# Patient Record
Sex: Female | Born: 1955 | Race: White | Hispanic: No | Marital: Married | Smoking: Never smoker
Health system: Southern US, Community
[De-identification: ages and names within clinical notes are randomized; demographics above are authoritative.]

## PROBLEM LIST (undated history)

## (undated) DIAGNOSIS — A6000 Herpesviral infection of urogenital system, unspecified: Secondary | ICD-10-CM

## (undated) DIAGNOSIS — R112 Nausea with vomiting, unspecified: Secondary | ICD-10-CM

## (undated) DIAGNOSIS — F329 Major depressive disorder, single episode, unspecified: Secondary | ICD-10-CM

## (undated) DIAGNOSIS — T8859XA Other complications of anesthesia, initial encounter: Secondary | ICD-10-CM

## (undated) DIAGNOSIS — Z9889 Other specified postprocedural states: Secondary | ICD-10-CM

## (undated) DIAGNOSIS — G4700 Insomnia, unspecified: Secondary | ICD-10-CM

## (undated) DIAGNOSIS — F32A Depression, unspecified: Secondary | ICD-10-CM

## (undated) DIAGNOSIS — F419 Anxiety disorder, unspecified: Secondary | ICD-10-CM

## (undated) DIAGNOSIS — G43909 Migraine, unspecified, not intractable, without status migrainosus: Secondary | ICD-10-CM

## (undated) HISTORY — DX: Major depressive disorder, single episode, unspecified: F32.9

## (undated) HISTORY — DX: Herpesviral infection of urogenital system, unspecified: A60.00

## (undated) HISTORY — DX: Insomnia, unspecified: G47.00

## (undated) HISTORY — PX: AUGMENTATION MAMMAPLASTY: SUR837

## (undated) HISTORY — DX: Anxiety disorder, unspecified: F41.9

## (undated) HISTORY — DX: Depression, unspecified: F32.A

---

## 1979-01-06 HISTORY — PX: ABDOMINAL HYSTERECTOMY: SHX81

## 1995-01-06 HISTORY — PX: BREAST BIOPSY: SHX20

## 1995-01-06 HISTORY — PX: BREAST EXCISIONAL BIOPSY: SUR124

## 1997-06-18 ENCOUNTER — Other Ambulatory Visit: Admission: RE | Admit: 1997-06-18 | Discharge: 1997-06-18 | Payer: Self-pay | Admitting: Oral Surgery

## 1997-09-18 ENCOUNTER — Other Ambulatory Visit: Admission: RE | Admit: 1997-09-18 | Discharge: 1997-09-18 | Payer: Self-pay | Admitting: Gynecology

## 1998-09-24 ENCOUNTER — Other Ambulatory Visit: Admission: RE | Admit: 1998-09-24 | Discharge: 1998-09-24 | Payer: Self-pay | Admitting: Gynecology

## 1998-10-22 ENCOUNTER — Encounter: Admission: RE | Admit: 1998-10-22 | Discharge: 1998-10-22 | Payer: Self-pay | Admitting: Gynecology

## 1998-10-22 ENCOUNTER — Encounter: Payer: Self-pay | Admitting: Gynecology

## 1999-01-16 ENCOUNTER — Encounter: Payer: Self-pay | Admitting: Internal Medicine

## 1999-01-16 ENCOUNTER — Inpatient Hospital Stay (HOSPITAL_COMMUNITY): Admission: EM | Admit: 1999-01-16 | Discharge: 1999-01-17 | Payer: Self-pay | Admitting: Emergency Medicine

## 1999-01-17 ENCOUNTER — Encounter: Payer: Self-pay | Admitting: Internal Medicine

## 1999-09-30 ENCOUNTER — Other Ambulatory Visit: Admission: RE | Admit: 1999-09-30 | Discharge: 1999-09-30 | Payer: Self-pay | Admitting: Gynecology

## 1999-10-23 ENCOUNTER — Encounter: Admission: RE | Admit: 1999-10-23 | Discharge: 1999-10-23 | Payer: Self-pay | Admitting: Gynecology

## 1999-10-23 ENCOUNTER — Encounter: Payer: Self-pay | Admitting: Gynecology

## 2000-09-29 ENCOUNTER — Other Ambulatory Visit: Admission: RE | Admit: 2000-09-29 | Discharge: 2000-09-29 | Payer: Self-pay | Admitting: Gynecology

## 2000-10-27 ENCOUNTER — Encounter: Admission: RE | Admit: 2000-10-27 | Discharge: 2000-10-27 | Payer: Self-pay | Admitting: Gynecology

## 2000-10-27 ENCOUNTER — Encounter: Payer: Self-pay | Admitting: Gynecology

## 2000-11-01 ENCOUNTER — Encounter: Payer: Self-pay | Admitting: Gynecology

## 2000-11-01 ENCOUNTER — Encounter: Admission: RE | Admit: 2000-11-01 | Discharge: 2000-11-01 | Payer: Self-pay | Admitting: Gynecology

## 2001-03-22 ENCOUNTER — Encounter: Payer: Self-pay | Admitting: Gynecology

## 2001-03-22 ENCOUNTER — Encounter: Admission: RE | Admit: 2001-03-22 | Discharge: 2001-03-22 | Payer: Self-pay | Admitting: Gynecology

## 2001-06-28 ENCOUNTER — Inpatient Hospital Stay (HOSPITAL_COMMUNITY): Admission: EM | Admit: 2001-06-28 | Discharge: 2001-06-30 | Payer: Self-pay | Admitting: Emergency Medicine

## 2001-06-29 ENCOUNTER — Encounter: Payer: Self-pay | Admitting: Internal Medicine

## 2001-09-29 ENCOUNTER — Other Ambulatory Visit: Admission: RE | Admit: 2001-09-29 | Discharge: 2001-09-29 | Payer: Self-pay | Admitting: Gynecology

## 2001-11-02 ENCOUNTER — Encounter: Admission: RE | Admit: 2001-11-02 | Discharge: 2001-11-02 | Payer: Self-pay | Admitting: Gynecology

## 2001-11-02 ENCOUNTER — Encounter: Payer: Self-pay | Admitting: Gynecology

## 2002-10-02 ENCOUNTER — Other Ambulatory Visit: Admission: RE | Admit: 2002-10-02 | Discharge: 2002-10-02 | Payer: Self-pay | Admitting: Gynecology

## 2002-12-19 ENCOUNTER — Encounter: Admission: RE | Admit: 2002-12-19 | Discharge: 2002-12-19 | Payer: Self-pay | Admitting: Gynecology

## 2003-09-24 ENCOUNTER — Other Ambulatory Visit: Admission: RE | Admit: 2003-09-24 | Discharge: 2003-09-24 | Payer: Self-pay | Admitting: Gynecology

## 2004-01-11 ENCOUNTER — Encounter: Admission: RE | Admit: 2004-01-11 | Discharge: 2004-01-11 | Payer: Self-pay | Admitting: Gynecology

## 2004-09-03 ENCOUNTER — Encounter: Admission: RE | Admit: 2004-09-03 | Discharge: 2004-09-03 | Payer: Self-pay | Admitting: Gynecology

## 2004-09-18 ENCOUNTER — Encounter: Admission: RE | Admit: 2004-09-18 | Discharge: 2004-09-18 | Payer: Self-pay | Admitting: Gynecology

## 2004-09-18 HISTORY — PX: BREAST CYST ASPIRATION: SHX578

## 2004-10-21 ENCOUNTER — Other Ambulatory Visit: Admission: RE | Admit: 2004-10-21 | Discharge: 2004-10-21 | Payer: Self-pay | Admitting: Gynecology

## 2005-10-27 ENCOUNTER — Other Ambulatory Visit: Admission: RE | Admit: 2005-10-27 | Discharge: 2005-10-27 | Payer: Self-pay | Admitting: Gynecology

## 2005-12-16 ENCOUNTER — Ambulatory Visit: Payer: Self-pay | Admitting: Gastroenterology

## 2006-01-08 ENCOUNTER — Ambulatory Visit: Payer: Self-pay | Admitting: Gastroenterology

## 2006-01-22 ENCOUNTER — Ambulatory Visit: Payer: Self-pay | Admitting: Gastroenterology

## 2006-09-29 ENCOUNTER — Encounter: Admission: RE | Admit: 2006-09-29 | Discharge: 2006-09-29 | Payer: Self-pay | Admitting: Gynecology

## 2006-11-01 ENCOUNTER — Other Ambulatory Visit: Admission: RE | Admit: 2006-11-01 | Discharge: 2006-11-01 | Payer: Self-pay | Admitting: Gynecology

## 2007-09-30 ENCOUNTER — Encounter: Admission: RE | Admit: 2007-09-30 | Discharge: 2007-09-30 | Payer: Self-pay | Admitting: Gynecology

## 2007-11-21 ENCOUNTER — Other Ambulatory Visit: Admission: RE | Admit: 2007-11-21 | Discharge: 2007-11-21 | Payer: Self-pay | Admitting: Gynecology

## 2009-01-05 HISTORY — PX: BREAST SURGERY: SHX581

## 2009-07-15 ENCOUNTER — Encounter: Admission: RE | Admit: 2009-07-15 | Discharge: 2009-07-15 | Payer: Self-pay | Admitting: Internal Medicine

## 2010-05-23 NOTE — H&P (Signed)
. [facility]  Patient:    Briana Turner, Briana Turner Visit Number: [id number] MRN: [id number]          Service Type: MED Location: [phone] Attending Physician:  Dolores Patty Dictated by:   Titus Dubin. Alwyn Ren, M.D. [facility] Admit Date:  06/28/2001   CC:         Sonda Primes, M.D.   History and Physical  HISTORY OF PRESENT ILLNESS:  Briana Turner is a 55 year old white female admitted with community acquired atypical pneumonia.  At approximately 12 midnight on 06/25/01, Briana Turner began to have arthralgia, myalgias, and generalized pain associated with chills and fever.  This persisted over the next 24 to 36 hours with temperatures up to 100.7.  This was also associated with shortness of breath and nonproductive cough. Additionally, Briana Turner has had abdominal pain related to the cough, and some bilateral pleuritic pain with deep inspiration.  The shortness of breath has been progressive over the last 24 hours, associated with the pleuritic pain, particularly in the interscapular area.  On Friday evening Briana Turner did feel something brush against her leg.  Briana Turner subsequently found a small punctate lesion, but did not definitely have tick exposure.  Briana Turner has had no other significant travel except to the mountains on the weekends.  Her brother went to [place] in April 2003, but has not been ill.  PAST MEDICAL HISTORY: 1. Partial hysterectomy for heavy menses and cysts. 2. Oophorectomy. 3. Chest pain admission in 1997 which was negative for cardiac disease. 4. Breast biopsy as an outpatient.  CURRENT MEDICATIONS:  Prophylactic acyclovir 400 mg b.i.d.  ALLERGIES:  No known drug allergies.  SOCIAL HISTORY:  Briana Turner does not smoke or drink.  Briana Turner exercises regularly.  FAMILY HISTORY:  Negative for pulmonary disease.  Her mother had myocardial infarction.  Father had cancer of the throat.  Sister had open heart surgery.  In addition to the review of systems as noted  above, Briana Turner has had diffuse headaches.  Briana Turner also has had urinary infrequency.  Briana Turner denies any change in bowel habits.  PHYSICAL EXAMINATION:  VITAL SIGNS:  Briana Turner appears acutely ill with a temperature of 101.9, blood pressure 116/74, pulse 98, respiratory rate 36, and O2 saturations 98% on room air.  HEENT:  Briana Turner exhibits a barky raspy cough.  Pupils are equal, round and reactive to light.  Fundal examination is benign.  There is mild erythema of the oropharynx.  Briana Turner has no lymphadenopathy of the head, neck or axilla. Tympanic membranes are slightly erythematous.  There is mild erythema of the nares.  NECK:  Supple.  Thyroid is normal to palpation.  CHEST:  Clear to auscultation, although Briana Turner coughs repeatedly.  There is a grade 1 systolic murmur.  ABDOMEN:  There is dullness in the right upper quadrant, and Briana Turner tends to splint somewhat.  There is no definite organomegaly.  Briana Turner is well tanned.  NEUROLOGIC:  There are no localizing neurologic signs.  BREASTS/PELVIC/RECTAL:  Initially deferred as they are not pertinent for this admission.  EXTREMITIES:  All pulses are intact, no deficits.  No aneurysms or bruits are noted.  No clubbing or cyanosis is present.  LABORATORY DATA:  Chest x-ray reveals bilateral pneumonia with radiographic negative for pulmonary edema pattern.  White blood cell count 7800, hematocrit 42.4.  Urine shows 2+ bacteria.  PLAN:  Briana Turner will be admitted with blood cultures and urine cultures.  Briana Turner will be placed on Rocephin and Zithromax intravenously.  [facility]  Spotted Fever titer will be collected.  Pulmonary toilet will be initiated. Dictated by:   Titus Dubin. Alwyn Ren, M.D. [facility] Attending Physician:  Dolores Patty DD:  06/28/01 TD:  06/29/01 Job: [id number]

## 2010-05-23 NOTE — Assessment & Plan Note (Signed)
[facility]                         GASTROENTEROLOGY OFFICE NOTE   NAME:Briana, Turner                       MRN:          [id number]  DATE:12/16/2005                            DOB:          04/10/1955    REFERRED BY:  Laurene Footman, P.A., [facility] [address].   REASON FOR REFERRAL:  Laurene Footman asked me to evaluate [name] in  consultation regarding intermittent rectal bleeding.   HPI:  [name] is a very pleasant 55 year old woman, who over the past  year, has had intermittent mild rectal bleeding.  She describes this as  bright red.  It is usually on the tissue paper.  Sometimes there is  dripping of blood into the water.  This happens approximately once every  month or two.  It lasts for two to three days.  It only occurs with  bowel movements.  She does not sound to be particularly constipated  around these times.  In fact, she is never really bothered by  constipation.   She also has rare intermittent pyrosis that happens every month or two.  This is taken care of by Tums only.  She has no dysphagia.   REVIEW OF SYSTEMS:  Notable for stable weight and is otherwise  essentially normal and is available on her nursing intake sheet.   PAST MEDICAL HISTORY:  Genital herpes, well-treated with twice daily  acyclovir.  Depression.  Hysterectomy in 1981.   CURRENT MEDICATIONS:  Acyclovir, multivitamins and sertraline.   ALLERGIES:  No known drug allergies.   SOCIAL HISTORY:  Divorced, lives by herself.  Works as an Print production planner  for [employer].  Nonsmoker, nondrinker.   FAMILY HISTORY:  No colon cancer or colon polyps in family.   PHYSICAL EXAMINATION:  The patient is 5 feet 3 inches, 111 pounds.  Blood pressure is 112/56, pulse 66.  CONSTITUTIONAL:  Generally well-appearing.  NEUROLOGIC:  Alert and oriented times three.  EYES:  Extraocular movements are intact.  MOUTH:  Oropharynx moist, no lesions.  NECK:  Supple, no  lymphadenopathy.  CARDIOVASCULAR/HEART:  Regular rate and rhythm.  LUNGS:  Clear to auscultation bilaterally.  ABDOMEN:  Soft, nontender, nondistended.  Normal bowel sounds.  EXTREMITIES:  No extremity edema.  SKIN:  No rashes or lesions on the lower extremities.   ASSESSMENT AND PLAN:  The patient is a 55 year old woman with  intermittent minor rectal bleeding.   First, she does not appear anemic, although we will make sure she has  had a recent CBC, just to be sure.  She had blood drawn at Urgent Care  in [place].  We will have those faxed over.  She did not have a CBC.  We  will do that for her today.  This seems like it is likely hemorrhoidal  bleeding, given its minor nature.  Other possibilities include anal  fissuring, although this usually causes some pain, or a rectal neoplasm.  We will arrange for her to have full colonoscopy at her soonest  convenience.  She prefers to wait until January.  I think that is  reasonable as long  as she is not indeed anemic.  If she is anemic, I  would want to do this a bit sooner than January.     [name], MD  Electronically Signed    DPJ/MedQ  DD: 12/16/2005  DT: 12/16/2005  Job #: [id number]   cc:   Laurene Footman, [facility]gent [facility], [place]

## 2010-05-23 NOTE — Discharge Summary (Signed)
[place]  Patient:    LELANIA, BIA Visit Number: [id number] MRN: [id number]          Service Type: MED Location: [phone] Attending Physician:  Dolores Patty Dictated by:   Cornell Barman, P.A. Admit Date:  06/28/2001 Discharge Date: 06/30/2001   CC:         Sonda Primes, M.D.   Discharge Summary  DISCHARGE DIAGNOSES: 1. Fever. 2. Bilateral patchy pneumonia.  BRIEF ADMISSION HISTORY:  The patient is a 55 year old white female admitted with a community acquired atypical pneumonia.  She described the onset of arthralgias, myalgias, fever and chills on June 25, 2001. She also developed fever up to 100.7.  She then developed shortness of breath and a nonproductive cough.  The patient was seen at Urgent Care on June 28, 2001 and diagnosed with bilateral patchy pneumonia.  The patients white count was normal at that time.  She was sent for admission at [facility].  PAST MEDICAL HISTORY: 1. Status post partial hysterectomy and oophorectomy. 2. History of chest pain in 1997, negative for cardiac disease. 3. Status post breast biopsy as an outpatient.  HOSPITAL COURSE:  #1 - INFECTIOUS DISEASE:  The patient presented with fever.  Her white count was normal.  She had evidence of bilateral patchy pneumonia.  She was empirically started on Rocephin and Zithromax.  The patients fever did resolve, however, she did have one further low grade temperature spike before discharge.  The patients white count remained normal.  Blood cultures were negative.  Follow-up chest x-ray in the hospital did show stable patchy bilateral air space disease with a nodular component.  The patient was changed to oral antibiotics on June 29, 2001.  The patients 02 saturations remained greater than 98% on room air.  We did check a [facility] spotted fever titer which is still pending.  The patient did have a nodular component on her chest  x-ray and it was recommended that she have a follow-up chest x-ray versus CT of the chest after she completes her antibiotic course.  LABORATORIES AT DISCHARGE:  B-met was normal.  [facility] spotted fever titer, IgG and IgM are pending.  Blood cultures were negative times two.  MEDICATIONS AT DISCHARGE: 1. Ceftin 250 mg b.i.d. for seven days. 2. Zithromax 250 mg q.d. for two days. 3. Acyclovir 400 mg as at home. 4. Guaifenesin 600 mg two tablets b.i.d. for seven days. 5. Tylenol and ibuprofen as needed.  FOLLOW-UP:  Follow-up with Dr. Posey Rea on Tuesday, July 8 at 10:45.  At that time she should have a chest x-ray or at the discretion of Dr. Posey Rea, a CT of the chest. Dictated by:   Cornell Barman, P.A. Attending Physician:  Dolores Patty DD:  06/30/01 TD:  07/01/01 Job: [id number]

## 2011-01-23 ENCOUNTER — Ambulatory Visit (INDEPENDENT_AMBULATORY_CARE_PROVIDER_SITE_OTHER): Payer: BC Managed Care – PPO

## 2011-01-23 DIAGNOSIS — S9030XA Contusion of unspecified foot, initial encounter: Secondary | ICD-10-CM

## 2011-01-23 DIAGNOSIS — M79609 Pain in unspecified limb: Secondary | ICD-10-CM

## 2011-03-29 ENCOUNTER — Other Ambulatory Visit: Payer: Self-pay | Admitting: Physician Assistant

## 2011-05-29 ENCOUNTER — Other Ambulatory Visit: Payer: Self-pay | Admitting: Internal Medicine

## 2011-05-29 DIAGNOSIS — Z1231 Encounter for screening mammogram for malignant neoplasm of breast: Secondary | ICD-10-CM

## 2011-06-16 ENCOUNTER — Ambulatory Visit
Admission: RE | Admit: 2011-06-16 | Discharge: 2011-06-16 | Disposition: A | Discharge status: Home / Self Care | Payer: Self-pay | Source: Ambulatory Visit | Attending: Internal Medicine | Admitting: Internal Medicine

## 2011-06-16 ENCOUNTER — Other Ambulatory Visit: Payer: Self-pay | Admitting: Internal Medicine

## 2011-06-16 DIAGNOSIS — Z1231 Encounter for screening mammogram for malignant neoplasm of breast: Secondary | ICD-10-CM

## 2011-06-29 ENCOUNTER — Other Ambulatory Visit: Payer: Self-pay | Admitting: Physician Assistant

## 2011-09-24 ENCOUNTER — Ambulatory Visit: Payer: BC Managed Care – PPO

## 2011-09-24 ENCOUNTER — Ambulatory Visit (INDEPENDENT_AMBULATORY_CARE_PROVIDER_SITE_OTHER): Payer: BC Managed Care – PPO | Admitting: Family Medicine

## 2011-09-24 VITALS — BP 122/74 | HR 67 | Temp 98.6°F | Resp 16 | Ht 63.5 in | Wt 114.0 lb

## 2011-09-24 DIAGNOSIS — J069 Acute upper respiratory infection, unspecified: Secondary | ICD-10-CM

## 2011-09-24 DIAGNOSIS — J040 Acute laryngitis: Secondary | ICD-10-CM

## 2011-09-24 DIAGNOSIS — J40 Bronchitis, not specified as acute or chronic: Secondary | ICD-10-CM

## 2011-09-24 DIAGNOSIS — R05 Cough: Secondary | ICD-10-CM

## 2011-09-24 DIAGNOSIS — R071 Chest pain on breathing: Secondary | ICD-10-CM

## 2011-09-24 MED ORDER — SERTRALINE HCL 100 MG PO TABS: 150.0000 mg | ORAL_TABLET | Freq: Every day | ORAL | Status: DC

## 2011-09-24 MED ORDER — HYDROCOD POLST-CHLORPHEN POLST 10-8 MG/5ML PO LQCR: 5.0000 mL | Freq: Two times a day (BID) | ORAL | Status: DC | PRN

## 2011-09-24 MED ORDER — ACYCLOVIR 400 MG PO TABS: 400.0000 mg | ORAL_TABLET | Freq: Two times a day (BID) | ORAL | Status: DC

## 2011-09-24 MED ORDER — AZITHROMYCIN 250 MG PO TABS: ORAL_TABLET | ORAL | Status: DC

## 2011-09-24 MED ORDER — ZOLPIDEM TARTRATE 10 MG PO TABS: 10.0000 mg | ORAL_TABLET | Freq: Every evening | ORAL | Status: DC | PRN

## 2011-09-24 MED ORDER — METHYLPREDNISOLONE ACETATE 80 MG/ML IJ SUSP
80.0000 mg | Freq: Once | INTRAMUSCULAR | Status: AC
  Administered 2011-09-24: 80 mg via INTRAMUSCULAR

## 2011-09-24 NOTE — Progress Notes (Signed)
  Subjective:    Patient ID: Briana Turner, female    DOB: Apr 21, 1955, 56 y.o.   MRN: [id number]  HPI   Lost voice 4d prev and slowly w/ worsening chest cong, prod cough, sore throat today. Getting worse.  Pt does get layrngitis freq but not usu feel this bad w/ cold. No f/c, tol po, no n/v/c/d. No urine sxs.  Very painful to take a deep breath.     Review of Systems  Constitutional: Positive for activity change and fatigue. Negative for fever and chills.  HENT: Positive for ear pain, congestion, sore throat, rhinorrhea, voice change and postnasal drip. Negative for trouble swallowing and sinus pressure.   Respiratory: Positive for cough. Negative for shortness of breath.   Cardiovascular: Positive for chest pain.  Gastrointestinal: Negative for nausea, vomiting, diarrhea and constipation.  Genitourinary: Negative for dysuria and difficulty urinating.  Neurological: Positive for headaches.       Objective:   Physical Exam  Constitutional: She is oriented to person, place, and time. She appears well-developed and well-nourished. No distress.  HENT:  Head: Normocephalic and atraumatic.  Right Ear: Tympanic membrane, external ear and ear canal normal.  Left Ear: Tympanic membrane, external ear and ear canal normal.  Nose: Mucosal edema and rhinorrhea present. Right sinus exhibits maxillary sinus tenderness. Left sinus exhibits no maxillary sinus tenderness.  Mouth/Throat: Oropharynx is clear and moist. No oropharyngeal exudate.       Pt whispering, when straining voice is barely audible.  Eyes: Conjunctivae normal and EOM are normal. Pupils are equal, round, and reactive to light. No scleral icterus.  Neck: Normal range of motion. Neck supple. No thyromegaly present.  Cardiovascular: Normal rate, regular rhythm, normal heart sounds and intact distal pulses.   Pulmonary/Chest: Effort normal. No respiratory distress. She has no decreased breath sounds. She has wheezes in the left upper  field. She has rhonchi in the right lower field. She exhibits tenderness and bony tenderness. She exhibits no crepitus.  Lymphadenopathy:    She has no cervical adenopathy.  Neurological: She is alert and oriented to person, place, and time.  Skin: Skin is warm and dry. She is not diaphoretic.  Psychiatric: She has a normal mood and affect.         CXR: No significant abnormality seen. Assessment & Plan:  1. Bronchitis - suspect viral, continue with symptomatic care and wait for CXR overread. Tuessionex for cough. Gave [facility] rx for z-pack. Do not fill now but  Can fill if develop f/c/worsening sxs w/ cough, SHoB, etc. Pt agreeable to plan. 2. Laryngitis - IM solumedrol x 1 now in clinic for symptomatic care. 3.  Mood d/o - stable, refilled zoloft 4. Insomnia - ok to cont prn ambien use - only uses 2.5mg  1-2 x/wk 5. HSV - cont prophylactic acyclovir, refilled

## 2011-12-14 ENCOUNTER — Other Ambulatory Visit: Payer: Self-pay | Admitting: Physician Assistant

## 2011-12-15 NOTE — Telephone Encounter (Signed)
Chart pulled to PA pool [id number]

## 2011-12-15 NOTE — Telephone Encounter (Signed)
Please pull chart.

## 2012-01-22 ENCOUNTER — Ambulatory Visit (INDEPENDENT_AMBULATORY_CARE_PROVIDER_SITE_OTHER): Payer: BC Managed Care – PPO | Admitting: Family Medicine

## 2012-01-22 ENCOUNTER — Encounter: Payer: Self-pay | Admitting: Family Medicine

## 2012-01-22 VITALS — BP 110/74 | HR 58 | Temp 97.9°F | Resp 16 | Ht 62.75 in | Wt 115.2 lb

## 2012-01-22 DIAGNOSIS — G4700 Insomnia, unspecified: Secondary | ICD-10-CM

## 2012-01-22 DIAGNOSIS — Z0000 Encounter for general adult medical examination without abnormal findings: Secondary | ICD-10-CM

## 2012-01-22 DIAGNOSIS — R197 Diarrhea, unspecified: Secondary | ICD-10-CM

## 2012-01-22 DIAGNOSIS — F329 Major depressive disorder, single episode, unspecified: Secondary | ICD-10-CM | POA: Insufficient documentation

## 2012-01-22 DIAGNOSIS — F32A Depression, unspecified: Secondary | ICD-10-CM | POA: Insufficient documentation

## 2012-01-22 DIAGNOSIS — B009 Herpesviral infection, unspecified: Secondary | ICD-10-CM

## 2012-01-22 LAB — CBC WITH DIFFERENTIAL/PLATELET
Basophils Absolute: 0.1 10*3/uL (ref 0.0–0.1)
Basophils Relative: 1 % (ref 0–1)
Eosinophils Absolute: 0.1 10*3/uL (ref 0.0–0.7)
Eosinophils Relative: 3 % (ref 0–5)
HCT: 38.5 % (ref 36.0–46.0)
Hemoglobin: 13 g/dL (ref 12.0–15.0)
Lymphocytes Relative: 36 % (ref 12–46)
Lymphs Abs: 1.8 10*3/uL (ref 0.7–4.0)
MCH: 31 pg (ref 26.0–34.0)
MCHC: 33.8 g/dL (ref 30.0–36.0)
MCV: 91.9 fL (ref 78.0–100.0)
Monocytes Absolute: 0.6 10*3/uL (ref 0.1–1.0)
Monocytes Relative: 12 % (ref 3–12)
Neutro Abs: 2.4 10*3/uL (ref 1.7–7.7)
Neutrophils Relative %: 48 % (ref 43–77)
Platelets: 258 10*3/uL (ref 150–400)
RBC: 4.19 MIL/uL (ref 3.87–5.11)
RDW: 12.8 % (ref 11.5–15.5)
WBC: 5 10*3/uL (ref 4.0–10.5)

## 2012-01-22 LAB — COMPREHENSIVE METABOLIC PANEL
ALT: 17 U/L (ref 0–35)
AST: 25 U/L (ref 0–37)
Albumin: 4.2 g/dL (ref 3.5–5.2)
Alkaline Phosphatase: 67 U/L (ref 39–117)
BUN: 20 mg/dL (ref 6–23)
CO2: 27 mEq/L (ref 19–32)
Calcium: 9.6 mg/dL (ref 8.4–10.5)
Chloride: 102 mEq/L (ref 96–112)
Creat: 0.63 mg/dL (ref 0.50–1.10)
Glucose, Bld: 86 mg/dL (ref 70–99)
Potassium: 4.7 mEq/L (ref 3.5–5.3)
Sodium: 138 mEq/L (ref 135–145)
Total Bilirubin: 0.5 mg/dL (ref 0.3–1.2)
Total Protein: 6.7 g/dL (ref 6.0–8.3)

## 2012-01-22 LAB — LIPID PANEL
Cholesterol: 186 mg/dL (ref 0–200)
HDL: 65 mg/dL (ref >39)
LDL Cholesterol: 110 mg/dL — ABNORMAL HIGH (ref 0–99)
Total CHOL/HDL Ratio: 2.9 Ratio
Triglycerides: 56 mg/dL (ref <150)
VLDL: 11 mg/dL (ref 0–40)

## 2012-01-22 LAB — TSH: TSH: 2.122 u[IU]/mL (ref 0.350–4.500)

## 2012-01-22 MED ORDER — ACYCLOVIR 400 MG PO TABS: 400.0000 mg | ORAL_TABLET | Freq: Every day | ORAL | Status: DC

## 2012-01-22 MED ORDER — SERTRALINE HCL 100 MG PO TABS: 150.0000 mg | ORAL_TABLET | Freq: Every day | ORAL | Status: DC

## 2012-01-22 MED ORDER — ZOLPIDEM TARTRATE 10 MG PO TABS: 10.0000 mg | ORAL_TABLET | Freq: Every evening | ORAL | Status: DC | PRN

## 2012-01-22 NOTE — Progress Notes (Signed)
Subjective:    Patient ID: Briana Turner, female    DOB: 12-31-1955, 57 y.o.   MRN: [id number] Chief Complaint  Patient presents with  . Annual Exam    HPI  Briana Turner is a delightful 57 yo female in her her annual preventative care.   Tons of menopausal sxs. Has been on zoloft for depression for years.- no desire to go off even though this was the first time she was an ssri.  Chronically depressed her whole life - just woke up every morning thinking that she wanted to die for most of her life, but doing great and no problems ever since starting on the zoloft about 10 yr ago. Using [place] rarely  Has been able to cut acyclovir back to once a day - no outbreaks.  Fasting today Normal colonoscopy in 2008 - was normal. Recheck in 2018 Had normal mammogram in May 2013 - no h/o abnml. Is taking a mvi, and calcium w/ vit D No h/o abnml paps, s/p hysterectomy for menorrhagia. tdap 05/2010   Got married in Oct and fell that morning req stiches - fell out a door over steps and hit garage floor and faceplanted.  Since then she has had some problems in her right neck and shoulder  everytime she eats, has diarrhea, no abd pain. No flatulence or gas, but are stools liquid -  happening most meals - happens w/ almost every food except for peanut butter and crackers, but no cramping, passes the liquid almost immed after eating so difficult to go out.  No traveling or odd foods.  Has been happening consistently for the past sev mos and seems to be worsening.  No past medical history on file. Past Surgical History  Procedure Date  . Breast surgery - implants 2011  . Abdominal hysterectomy 1981    PARTIAL  Has had hysterectomy because of bad periods in 1981 - no cancer. Still has one ovary (other 1 removed due to painful scar tissue.)     Family History  Problem Relation Age of Onset  . Diabetes Sister   . Heart disease Sister   Dad w/ throat cancer. History   Social History  . Marital Status:  Single    Spouse Name: N/A    Number of Children: N/A  . Years of Education: N/A   Occupational History  . OFFICE MANAGER    Social History Main Topics  . Smoking status: Never Smoker   . Smokeless tobacco: None  . Alcohol Use: No  . Drug Use: No  . Sexually Active: Yes    Birth Control/ Protection: None     Comment: NUMBER OF SEX PARTNERS IN THE LAST 12 - 1   Other Topics Concern  . None   Social History Narrative  . None  Got married in Oct 2012  Current Outpatient Prescriptions on File Prior to Visit  Medication Sig Dispense Refill  . sertraline (ZOLOFT) 100 MG tablet Take 1.5 tablets (150 mg total) by mouth daily.  135 tablet  3  . zolpidem (AMBIEN) 10 MG tablet Take 1 tablet (10 mg total) by mouth at bedtime as needed for sleep.  30 tablet  5   Not on File   Review of Systems  Constitutional: Positive for diaphoresis. Negative for fever, chills, activity change, appetite change, fatigue and unexpected weight change.  HENT: Positive for neck pain and neck stiffness. Negative for hearing loss, ear pain, nosebleeds, congestion, sore throat, facial swelling, rhinorrhea, sneezing, drooling, mouth  sores, trouble swallowing, dental problem, voice change, postnasal drip, sinus pressure, tinnitus and ear discharge.   Eyes: Negative for photophobia, pain, discharge, redness, itching and visual disturbance.  Respiratory: Negative for apnea, cough, choking, chest tightness, shortness of breath, wheezing and stridor.   Cardiovascular: Negative for chest pain, palpitations and leg swelling.  Gastrointestinal: Positive for diarrhea. Negative for nausea, vomiting, abdominal pain, constipation, blood in stool, abdominal distention, anal bleeding and rectal pain.  Genitourinary: Negative for dysuria, urgency, frequency, hematuria, flank pain, decreased urine volume, vaginal bleeding, vaginal discharge, enuresis, difficulty urinating, genital sores, vaginal pain, menstrual problem, pelvic  pain and dyspareunia.  Musculoskeletal: Positive for myalgias and arthralgias. Negative for back pain, joint swelling and gait problem.  Skin: Negative for color change, pallor, rash and wound.  Neurological: Negative for dizziness, tremors, seizures, syncope, facial asymmetry, speech difficulty, weakness, light-headedness, numbness and headaches.  Hematological: Negative for adenopathy. Does not bruise/bleed easily.  Psychiatric/Behavioral: Negative for suicidal ideas, hallucinations, behavioral problems, confusion, sleep disturbance, self-injury, dysphoric mood, decreased concentration and agitation. The patient is not nervous/anxious and is not hyperactive.      BP 110/74  Pulse 58  Temp 97.9 F (36.6 C) (Oral)  Resp 16  Ht 5' 2.75" (1.594 m)  Wt 115 lb 3.2 oz (52.254 kg)  BMI 20.57 kg/m2  SpO2 100% Objective:   Physical Exam  Constitutional: She is oriented to person, place, and time. She appears well-developed and well-nourished. No distress.  HENT:  Head: Normocephalic and atraumatic.  Right Ear: Tympanic membrane, external ear and ear canal normal.  Left Ear: Tympanic membrane, external ear and ear canal normal.  Nose: Nose normal. No mucosal edema or rhinorrhea.  Mouth/Throat: Uvula is midline, oropharynx is clear and moist and mucous membranes are normal. No posterior oropharyngeal erythema.  Eyes: Conjunctivae normal and EOM are normal. Pupils are equal, round, and reactive to light. Right eye exhibits no discharge. Left eye exhibits no discharge. No scleral icterus.  Neck: Normal range of motion. Neck supple. No thyromegaly present.  Cardiovascular: Normal rate, regular rhythm, normal heart sounds and intact distal pulses.   Pulmonary/Chest: Effort normal and breath sounds normal. No respiratory distress.  Abdominal: Soft. Bowel sounds are normal. There is no hepatosplenomegaly. There is generalized tenderness. There is no rigidity, no rebound, no guarding, no CVA tenderness,  no tenderness at McBurney's point and negative Murphy's sign. No hernia.  Genitourinary: No breast swelling, tenderness, discharge or bleeding.       Bilateral breast implants  Musculoskeletal: She exhibits no edema.  Lymphadenopathy:    She has no cervical adenopathy.  Neurological: She is alert and oriented to person, place, and time. She has normal reflexes.  Skin: Skin is warm and dry. She is not diaphoretic. No erythema.  Psychiatric: She has a normal mood and affect. Her behavior is normal.      Assessment & Plan:   1. Depression  sertraline (ZOLOFT) 100 MG tablet, Comprehensive metabolic panel, TSH  2. Insomnia  zolpidem (AMBIEN) 10 MG tablet, CBC with Differential  3. Herpes  acyclovir (ZOVIRAX) 400 MG tablet  4. Diarrhea  IFOBT POC (occult bld, rslt in office), Stool culture, Ova and parasite examination, Clostridium difficile EIA, Fecal lactoferrin  5. Routine general medical examination at a health care facility  Lipid panel.  Does not need any further pap smears in life due to benign hysterectomy.  Repeat colonoscopy due 2018, was nml. Mammogram done 05/2011 - UTD and nml. tdap 05/2010

## 2012-01-24 ENCOUNTER — Encounter: Payer: Self-pay | Admitting: Family Medicine

## 2012-01-25 ENCOUNTER — Other Ambulatory Visit: Payer: Self-pay | Admitting: Radiology

## 2012-01-25 LAB — IFOBT (OCCULT BLOOD): IFOBT: NEGATIVE

## 2012-01-25 NOTE — Addendum Note (Signed)
Addended by: Sheldon Silvan on: 01/25/2012 08:32 AM   Modules accepted: Orders

## 2012-01-26 LAB — OVA AND PARASITE EXAMINATION: OP: NONE SEEN

## 2012-01-26 LAB — FECAL LACTOFERRIN, QUANT: Lactoferrin: NEGATIVE

## 2012-01-26 LAB — CLOSTRIDIUM DIFFICILE EIA: CDIFTX: NEGATIVE

## 2012-01-28 ENCOUNTER — Telehealth: Payer: Self-pay

## 2012-01-28 DIAGNOSIS — K529 Noninfective gastroenteritis and colitis, unspecified: Secondary | ICD-10-CM

## 2012-01-28 NOTE — Telephone Encounter (Signed)
Pt states dr Clelia Croft wanted to know the type of vitamin and hot flash medicine she was on. Pt states she takes Mature MultiVitamins and Remifemin for hot flashes.  bf

## 2012-01-29 LAB — STOOL CULTURE

## 2012-02-05 NOTE — Telephone Encounter (Signed)
Well, as far as i know, none of these should cause chronic diarrhea so since all of her lab testing came back negative, it is ok for her to take imodium and please refer to to GI for further eval.

## 2012-02-09 NOTE — Telephone Encounter (Signed)
Was confused about where this came from, was attatched to lab, called patient left message to advise will proceed with a referral to GI. Left message for her to call back and advise where she has gone before for GI.

## 2012-09-07 ENCOUNTER — Ambulatory Visit (INDEPENDENT_AMBULATORY_CARE_PROVIDER_SITE_OTHER): Payer: BC Managed Care – PPO | Admitting: Family Medicine

## 2012-09-07 VITALS — BP 152/84 | HR 75 | Temp 97.8°F | Resp 20 | Ht 63.0 in | Wt 118.6 lb

## 2012-09-07 DIAGNOSIS — M791 Myalgia, unspecified site: Secondary | ICD-10-CM

## 2012-09-07 DIAGNOSIS — IMO0001 Reserved for inherently not codable concepts without codable children: Secondary | ICD-10-CM

## 2012-09-07 DIAGNOSIS — M542 Cervicalgia: Secondary | ICD-10-CM

## 2012-09-07 MED ORDER — METAXALONE 800 MG PO TABS: 400.0000 mg | ORAL_TABLET | Freq: Three times a day (TID) | ORAL | Status: DC | PRN

## 2012-09-07 NOTE — Progress Notes (Signed)
   [address] [place] [address]   Phone [phone]  Subjective:    Patient ID: Briana Turner, female    DOB: 28-Sep-1955, 57 y.o.   MRN: [id number]  HPI Pt presents to clinic with acute abrupt onset of R sided neck pain that started about 30 minutes ago that is taking her breath away it is so intense.  It slightly radiates down her L arm.  She has no h/o of cardiac issues, she has never had anxiety.  She has never had a known muscle spasm so unsure what they feel like but she knows she has never had pain like this before.  She does not feel like she is really SOB but the pain is definitely taking away her breath.  Does not hurt to move her arm. She was at the hair dresser when this started getting her hair cut and did not feel like she was holding her head funny.  Pt was brought back urgently and I was pulled into the room for immediate evaluation of the patient.  Review of Systems  Constitutional: Negative for fever and chills.  HENT: Positive for neck pain.   Respiratory: Positive for shortness of breath (related to the pain).   Neurological: Negative for numbness and headaches.       Objective:   Physical Exam  Vitals reviewed. Constitutional: She is oriented to person, place, and time. She appears well-developed and well-nourished.  HENT:  Head: Normocephalic and atraumatic.  Right Ear: External ear normal.  Left Ear: External ear normal.  Cardiovascular: Normal rate, regular rhythm and normal heart sounds.   No murmur heard. Pulmonary/Chest: Effort normal and breath sounds normal.  Musculoskeletal:       Arms: Good arm strength.  With arm movements increase pain with shoulder abduction. Palpable muscle spasm in L trapezius.  Neurological: She is alert and oriented to person, place, and time.  Skin: Skin is warm and dry.  Psychiatric: She has a normal mood and affect. Her behavior is normal. Judgment and thought content normal.    EKG - NSR without acute  changes.  When patient laid down for her EKG the pain resolved with head support.     Assessment & Plan:  Neck pain on left side - Plan: EKG 12-Lead  Muscle pain - Plan: metaxalone (SKELAXIN) 800 MG tablet  D/w pt the administration of IM Valium but due to significant pain reduction with head support pt would like to go home and heat the area and use NSAIDs.  I have sent Skelaxin to the pharmacy for patient to pick up if she needs it.  She will RTC if she has continued problems.  D/w Dr Neva Seat.  Benny Lennert PA-C 09/07/2012 6:41 PM

## 2012-09-08 NOTE — Progress Notes (Signed)
EKG read and patient discussed with Sarah Weber, PA-C. Agree with assessment and plan of care per her note.   

## 2012-11-15 ENCOUNTER — Other Ambulatory Visit: Payer: Self-pay | Admitting: Family Medicine

## 2012-11-16 NOTE — Telephone Encounter (Signed)
 CSD reviewed. Pt has only filled ambien twice in the past yr and only once from the rx I gave her in Jan. Other 4 refills had expired so using VERY rarely.

## 2012-12-10 ENCOUNTER — Ambulatory Visit (INDEPENDENT_AMBULATORY_CARE_PROVIDER_SITE_OTHER): Payer: BC Managed Care – PPO | Admitting: Family Medicine

## 2012-12-10 VITALS — BP 108/70 | HR 76 | Temp 98.4°F | Resp 16 | Ht 63.5 in | Wt 118.0 lb

## 2012-12-10 DIAGNOSIS — H53412 Scotoma involving central area, left eye: Secondary | ICD-10-CM

## 2012-12-10 DIAGNOSIS — H53419 Scotoma involving central area, unspecified eye: Secondary | ICD-10-CM

## 2012-12-10 DIAGNOSIS — R05 Cough: Secondary | ICD-10-CM

## 2012-12-10 DIAGNOSIS — J040 Acute laryngitis: Secondary | ICD-10-CM

## 2012-12-10 LAB — POCT RAPID STREP A (OFFICE): Rapid Strep A Screen: NEGATIVE

## 2012-12-10 NOTE — Progress Notes (Addendum)
Subjective:   This chart was scribed for Norberto Sorenson, MD by Arlan Organ, ED Scribe. This patient was seen in room Room/bed 4 and the patient's care was started 1:37 PM.    Patient ID: Briana Turner, female    DOB: 06-30-1955, 57 y.o.   MRN: [id number]  Chief Complaint  Patient presents with  . Spots and/or Floaters     L eye x 4 hours ago  . Cough    non productive x 5 days  . Laryngitis    x 5 days     HPI HPI Comments: Briana Turner is a 57 y.o. female who presents to [facility] complaining of laryngitis that first started 1 week ago. She also reports flu like symptoms, a non productive cough, a week long ongoing HA, and "spots" in her left visual field just lateral to the center of her eye she noticed 4 hours ago. She describes the spots as "dark spots clustered together". She states she initially experienced a few minutes of dizziness and lightheadedness prior to noticing the spots in her visual field. She says the spots are stationary and never move position, but track when she moved her eyes. She denies any recent trauma to her head. She states otherwise her vision is unchanged. She reports a subjective fever and chills 4 days ago, that have both resolved now. She denies any photophobia.    Ophthalmologist- Dr. London Sheer  No past medical history on file.   No Known Allergies   Current Outpatient Prescriptions on File Prior to Visit  Medication Sig Dispense Refill  . acyclovir (ZOVIRAX) 400 MG tablet Take 1 tablet (400 mg total) by mouth daily.  90 tablet  3  . sertraline (ZOLOFT) 100 MG tablet Take 1.5 tablets (150 mg total) by mouth daily.  135 tablet  3  . zolpidem (AMBIEN) 10 MG tablet TAKE ONE TABLET BY MOUTH ONCE DAILY AT BEDTIME FOR SLEEP  30 tablet  0  . metaxalone (SKELAXIN) 800 MG tablet Take 0.5-1 tablets (400-800 mg total) by mouth 3 (three) times daily as needed for pain.  10 tablet  0   No current facility-administered medications on file prior to visit.      Review of Systems  Constitutional: Negative for fever and chills.  HENT: Negative for congestion, sinus pressure and sore throat.   Eyes: Positive for visual disturbance. Negative for photophobia, pain, discharge and itching.  Respiratory: Positive for cough.   Musculoskeletal: Negative for myalgias.  Skin: Negative for rash.  Neurological: Positive for dizziness and light-headedness.    BP 108/70  Pulse 76  Temp(Src) 98.4 F (36.9 C) (Oral)  Resp 16  Ht 5' 3.5" (1.613 m)  Wt 118 lb (53.524 kg)  BMI 20.57 kg/m2  SpO2 99%  Objective:   Physical Exam  Nursing note and vitals reviewed. Constitutional: She is oriented to person, place, and time. She appears well-developed and well-nourished.  HENT:  Head: Normocephalic and atraumatic.  Right Ear: External ear normal. Tympanic membrane is not erythematous, not retracted and not bulging.  Left Ear: External ear normal. Tympanic membrane is not erythematous, not retracted and not bulging.  Mouth/Throat: Oropharyngeal exudate and posterior oropharyngeal erythema present. No posterior oropharyngeal edema.  Right worse then left oropharyngeal exudate  Eyes: Conjunctivae, EOM and lids are normal. Pupils are equal, round, and reactive to light. Lids are everted and swept, no foreign bodies found. Right eye exhibits no chemosis, no discharge and no exudate. Left eye exhibits no  chemosis, no discharge and no exudate.  Neck: Normal range of motion.  Cardiovascular: Normal rate, regular rhythm and normal heart sounds.   Pulmonary/Chest: Effort normal and breath sounds normal.  Musculoskeletal: Normal range of motion.  Neurological: She is alert and oriented to person, place, and time.  Skin: Skin is warm and dry.  Psychiatric: She has a normal mood and affect. Her behavior is normal.   Results for orders placed in visit on 12/10/12  CULTURE, GROUP A STREP      Result Value Range   Organism ID, Bacteria Normal Upper Respiratory Flora      Organism ID, Bacteria No Beta Hemolytic Streptococci Isolated    POCT RAPID STREP A (OFFICE)      Result Value Range   Rapid Strep A Screen Negative  Negative   Assessment & Plan:  Cough - Plan: POCT rapid strep A, Culture, Group A Strep  Laryngitis - Plan: POCT rapid strep A, Culture, Group A Strep - Pt was told to hold steroids, antibiotics or symptomatic cough medication, but to call in for prescriptions if symptoms persist for more than 4 days or if symptoms worsen int that time frame.  Paracentral scotoma of left eye - Plan: Ambulatory referral to Ophthalmology Spoke w/ opthalmologist on call - Dr. Stephannie Li at [facility] who reassured me/pt that This is likely benign and should resolve on its own but pt will be worked in for an OV w/in sev days - contact info given to schedule on Mon.  Advised patient to come back in for more immediate eval if symptoms do not improve or if they worsen.   I personally performed the services described in this documentation, which was scribed in my presence. The recorded information has been reviewed and considered, and addended by me as needed.  Norberto Sorenson, MD MPH

## 2012-12-10 NOTE — Patient Instructions (Signed)
You will want to follow-up with Dr. Stephannie Li at [facility] - please call their office and just tell them that you were seen this weekend and Dr. Allyne Gee said you could be worked in this week.  This is likely benign and should resolve on its own.  [address], [place], [place] [address]    [phone]  Laryngitis At the top of your windpipe is your voice box. It is the source of your voice. Inside your voice box are 2 bands of muscles called vocal cords. When you breathe, your vocal cords are relaxed and open so that air can get into the lungs. When you decide to say something, these cords come together and vibrate. The sound from these vibrations goes into your throat and comes out through your mouth as sound. Laryngitis is an inflammation of the vocal cords that causes hoarseness, cough, loss of voice, sore throat, and dry throat. Laryngitis can be temporary (acute) or long-term (chronic). Most cases of acute laryngitis improve with time.Chronic laryngitis lasts for more than 3 weeks. CAUSES Laryngitis can often be related to excessive smoking, talking, or yelling, as well as inhalation of toxic fumes and allergies. Acute laryngitis is usually caused by a viral infection, vocal strain, measles or mumps, or bacterial infections. Chronic laryngitis is usually caused by vocal cord strain, vocal cord injury, postnasal drip, growths on the vocal cords, or acid reflux. SYMPTOMS   Cough.  Sore throat.  Dry throat. RISK FACTORS  Respiratory infections.  Exposure to irritating substances, such as cigarette smoke, excessive amounts of alcohol, stomach acids, and workplace chemicals.  Voice trauma, such as vocal cord injury from shouting or speaking too loud. DIAGNOSIS  Your cargiver will perform a physical exam. During the physical exam, your caregiver will examine your throat. The most common sign of laryngitis is hoarseness. Laryngoscopy may be necessary to confirm  the diagnosis of this condition. This procedure allows your caregiver to look into the larynx. HOME CARE INSTRUCTIONS  Drink enough fluids to keep your urine clear or pale yellow.  Rest until you no longer have symptoms or as directed by your caregiver.  Breathe in moist air.  Take all medicine as directed by your caregiver.  Do not smoke.  Talk as little as possible (this includes whispering).  Write on paper instead of talking until your voice is back to normal.  Follow up with your caregiver if your condition has not improved after 10 days. SEEK MEDICAL CARE IF:   You have trouble breathing.  You cough up blood.  You have persistent fever.  You have increasing pain.  You have difficulty swallowing. MAKE SURE YOU:  Understand these instructions.  Will watch your condition.  Will get help right away if you are not doing well or get worse. Document Released: 12/22/2004 Document Revised: 03/16/2011 Document Reviewed: 02/27/2010 [facility] Patient Information 2014 [place], [place].

## 2012-12-12 LAB — CULTURE, GROUP A STREP

## 2013-01-23 ENCOUNTER — Other Ambulatory Visit: Payer: Self-pay | Admitting: Family Medicine

## 2013-01-24 NOTE — Telephone Encounter (Signed)
Called in.

## 2013-03-27 ENCOUNTER — Other Ambulatory Visit: Payer: Self-pay | Admitting: Family Medicine

## 2013-03-27 NOTE — Telephone Encounter (Signed)
Pt was seen in Dec but not for this med. Can we give RFs?

## 2013-05-08 ENCOUNTER — Ambulatory Visit (INDEPENDENT_AMBULATORY_CARE_PROVIDER_SITE_OTHER): Payer: BC Managed Care – PPO | Admitting: Family Medicine

## 2013-05-08 VITALS — BP 128/72 | HR 74 | Temp 98.3°F | Resp 16 | Ht 63.5 in | Wt 121.2 lb

## 2013-05-08 DIAGNOSIS — F329 Major depressive disorder, single episode, unspecified: Secondary | ICD-10-CM

## 2013-05-08 DIAGNOSIS — F32A Depression, unspecified: Secondary | ICD-10-CM

## 2013-05-08 DIAGNOSIS — L237 Allergic contact dermatitis due to plants, except food: Secondary | ICD-10-CM

## 2013-05-08 DIAGNOSIS — L255 Unspecified contact dermatitis due to plants, except food: Secondary | ICD-10-CM

## 2013-05-08 MED ORDER — TRIAMCINOLONE ACETONIDE 0.1 % EX CREA: 1.0000 "application " | TOPICAL_CREAM | Freq: Three times a day (TID) | CUTANEOUS | Status: DC | PRN

## 2013-05-08 MED ORDER — PREDNISONE 10 MG PO TABS
ORAL_TABLET | ORAL | Status: DC
Start: 2013-05-08 — End: 2013-06-02

## 2013-05-08 MED ORDER — SERTRALINE HCL 100 MG PO TABS: 150.0000 mg | ORAL_TABLET | Freq: Every day | ORAL | Status: DC

## 2013-05-08 NOTE — Progress Notes (Signed)
Subjective:    Patient ID: Briana Turner, female    DOB: [birth year], 58 y.o.   MRN: [id number] Chief Complaint  Patient presents with  . Poison Ivy    Arms; lower extremities x 3 dys   HPI  Briana Turner was gardening and doing yard work about 10d prev.  Sev days later she developed some spots of poison ivy dermatitis on her extremities and since then it has been progressively spreading - is now everywhere - esp all over arms and legs.  She developed new lesions today.  So itchy she can't sleep or sit.  Has been applying calamine lotion with minimal relief.   Went to the pharmacist to ask them what she could put on it and they recommended she seek medical care for oral treatment.  Otherwise doing well. Has appt w/ me sched for CPE in sev wks. Just needs refill of zoloft to get her to that appt - has other meds.  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   Past Medical History  Diagnosis Date  . Depression   . Insomnia   . Genital HSV    Current Outpatient Prescriptions on File Prior to Visit  Medication Sig Dispense Refill  . acyclovir (ZOVIRAX) 400 MG tablet TAKE 1 TABLET BY MOUTH EVERY DAY  90 tablet  2  . zolpidem (AMBIEN) 10 MG tablet TAKE 1 TABLET BY MOUTH EVERY NIGHT AT BEDTIME AS NEEDED FOR SLEEP  30 tablet  1  . metaxalone (SKELAXIN) 800 MG tablet Take 0.5-1 tablets (400-800 mg total) by mouth 3 (three) times daily as needed for pain.  10 tablet  0   No current facility-administered medications on file prior to visit.   No Known Allergies   Review of Systems  Constitutional: Positive for activity change. Negative for fever, chills, diaphoresis and appetite change.  Eyes: Positive for visual disturbance. Negative for pain, discharge and itching.  Musculoskeletal: Negative for arthralgias and joint swelling.  Skin: Positive for color change, rash and wound. Negative for pallor.  Hematological: Negative for adenopathy. Does not  bruise/bleed easily.  Psychiatric/Behavioral: Positive for sleep disturbance.      BP 128/72  Pulse 74  Temp(Src) 98.3 F (36.8 C) (Oral)  Resp 16  Ht 5' 3.5" (1.613 m)  Wt 121 lb 3.2 oz (54.976 kg)  BMI 21.13 kg/m2  SpO2 100% Objective:   Physical Exam  Constitutional: She is oriented to person, place, and time. She appears well-developed and well-nourished. No distress.  HENT:  Head: Normocephalic and atraumatic.  Right Ear: External ear normal.  Eyes: Conjunctivae are normal. No scleral icterus.  Pulmonary/Chest: Effort normal.  Neurological: She is alert and oriented to person, place, and time.  Skin: Skin is warm and dry. Rash noted. Rash is maculopapular and vesicular. She is not diaphoretic. No erythema.  Diffuse isolated erythematous papules, vesicles, and scaling in round and linear formation diffusely over all extremities. None on trunk.  Psychiatric: She has a normal mood and affect. Her behavior is normal.          Assessment & Plan:   Depression - Plan: sertraline (ZOLOFT) 100 MG tablet  Contact dermatitis due to poison ivy  Meds ordered this encounter  Medications  . sertraline (ZOLOFT) 100 MG tablet    Sig: Take 1.5 tablets (150 mg total) by mouth daily.    Dispense:  135 tablet    Refill:  3  . predniSONE (DELTASONE) 10 MG  tablet    Sig: 6-5-4-3-2-1 tabs po qd x taper 6d    Dispense:  21 tablet    Refill:  0  . triamcinolone cream (KENALOG) 0.1 %    Sig: Apply 1 application topically 3 (three) times daily as needed.    Dispense:  85 g    Refill:  1   Norberto SorensonEva Shaw, MD MPH

## 2013-05-08 NOTE — Patient Instructions (Signed)
Poison Ivy Poison ivy is a inflammation of the skin (contact dermatitis) caused by touching the allergens on the leaves of the ivy plant following previous exposure to the plant. The rash usually appears 48 hours after exposure. The rash is usually bumps (papules) or blisters (vesicles) in a linear pattern. Depending on your own sensitivity, the rash may simply cause redness and itching, or it may also progress to blisters which may break open. These must be well cared for to prevent secondary bacterial (germ) infection, followed by scarring. Keep any open areas dry, clean, dressed, and covered with an antibacterial ointment if needed. The eyes may also get puffy. The puffiness is worst in the morning and gets better as the day progresses. This dermatitis usually heals without scarring, within 2 to 3 weeks without treatment. HOME CARE INSTRUCTIONS  Thoroughly wash with soap and water as soon as you have been exposed to poison ivy. You have about one half hour to remove the plant resin before it will cause the rash. This washing will destroy the oil or antigen on the skin that is causing, or will cause, the rash. Be sure to wash under your fingernails as any plant resin there will continue to spread the rash. Do not rub skin vigorously when washing affected area. Poison ivy cannot spread if no oil from the plant remains on your body. A rash that has progressed to weeping sores will not spread the rash unless you have not washed thoroughly. It is also important to wash any clothes you have been wearing as these may carry active allergens. The rash will return if you wear the unwashed clothing, even several days later. Avoidance of the plant in the future is the best measure. Poison ivy plant can be recognized by the number of leaves. Generally, poison ivy has three leaves with flowering branches on a single stem. Diphenhydramine may be purchased over the counter and used as needed for itching. Do not drive with  this medication if it makes you drowsy.Ask your caregiver about medication for children. SEEK MEDICAL CARE IF:  Open sores develop.  Redness spreads beyond area of rash.  You notice purulent (pus-like) discharge.  You have increased pain.  Other signs of infection develop (such as fever). Document Released: 12/20/1999 Document Revised: 03/16/2011 Document Reviewed: 11/07/2008 [employer] Patient Information 2014 [employer].  

## 2013-06-02 ENCOUNTER — Ambulatory Visit (INDEPENDENT_AMBULATORY_CARE_PROVIDER_SITE_OTHER): Payer: BC Managed Care – PPO | Admitting: Family Medicine

## 2013-06-02 ENCOUNTER — Encounter: Payer: Self-pay | Admitting: Family Medicine

## 2013-06-02 VITALS — BP 110/69 | HR 72 | Temp 98.5°F | Resp 16 | Ht 63.5 in | Wt 119.0 lb

## 2013-06-02 DIAGNOSIS — G4700 Insomnia, unspecified: Secondary | ICD-10-CM

## 2013-06-02 DIAGNOSIS — F329 Major depressive disorder, single episode, unspecified: Secondary | ICD-10-CM

## 2013-06-02 DIAGNOSIS — R400 Somnolence: Secondary | ICD-10-CM

## 2013-06-02 DIAGNOSIS — F32A Depression, unspecified: Secondary | ICD-10-CM

## 2013-06-02 DIAGNOSIS — B009 Herpesviral infection, unspecified: Secondary | ICD-10-CM

## 2013-06-02 DIAGNOSIS — R404 Transient alteration of awareness: Secondary | ICD-10-CM

## 2013-06-02 DIAGNOSIS — Z0000 Encounter for general adult medical examination without abnormal findings: Secondary | ICD-10-CM

## 2013-06-02 LAB — POCT URINALYSIS DIPSTICK
Bilirubin, UA: NEGATIVE
Blood, UA: NEGATIVE
GLUCOSE UA: NEGATIVE
KETONES UA: NEGATIVE
LEUKOCYTES UA: NEGATIVE
Nitrite, UA: NEGATIVE
PROTEIN UA: NEGATIVE
SPEC GRAV UA: 1.02
UROBILINOGEN UA: 0.2
pH, UA: 7

## 2013-06-02 LAB — CBC
HCT: 40.4 % (ref 36.0–46.0)
Hemoglobin: 13.7 g/dL (ref 12.0–15.0)
MCH: 30.9 pg (ref 26.0–34.0)
MCHC: 33.9 g/dL (ref 30.0–36.0)
MCV: 91 fL (ref 78.0–100.0)
Platelets: 195 10*3/uL (ref 150–400)
RBC: 4.44 MIL/uL (ref 3.87–5.11)
RDW: 13.3 % (ref 11.5–15.5)
WBC: 4.4 10*3/uL (ref 4.0–10.5)

## 2013-06-02 MED ORDER — ZOLPIDEM TARTRATE 10 MG PO TABS: ORAL_TABLET | ORAL | Status: DC

## 2013-06-02 MED ORDER — SERTRALINE HCL 100 MG PO TABS: 150.0000 mg | ORAL_TABLET | Freq: Every day | ORAL | Status: DC

## 2013-06-02 MED ORDER — ACYCLOVIR 400 MG PO TABS: ORAL_TABLET | ORAL | Status: DC

## 2013-06-02 NOTE — Progress Notes (Addendum)
Subjective:    Patient ID: Briana Turner, female    DOB: September 19, 1955, 58 y.o.   MRN: [id number]  Chief Complaint  Patient presents with  . Annual Exam    HPI This chart was scribed for Briana Mocha, MD by Charline Bills, ED Scribe. The patient was seen in room 21. Patient's care was started at 1:34 PM.  HPI Comments: Briana Turner is a 58 y.o. female who presents to the [facility] for annual exam. Pt's last colonoscopy  in 2008 which was normal, recheck in 2018. Last mammogram was 05/29/2011, normal. Pt has had a hysterectomy due to menorrhagia and no h/o of abnormal pap smears. She denies vaginal discharge, dyspareunia, dysuria, vaginal dryness, itching or pain. She states that she is doing fine on her medications but does need refills.  Pt reports gradually worsening, difficulty staying awake while driving. She has noticed this in the morning, night and at stop lights. Pt denies feeling sleepy when watching TV. She does not nap during the day. Pt reports 6-7 hours of sleep nightly and feels well rested upon waking. She has tried drinking coffee, playing loud music and riding with the windows down. Pt states that she has not taken Ambien in a while. She states that she is wide awake once she gets to her destination but she has difficulty staying awake for the drive.  It is getting to the point where she has trouble driving anywhere and she is becoming concerned for safety as does freq fall asleep behind the wheel.  Past Medical History  Diagnosis Date  . Depression   . Insomnia   . Genital HSV    Past Surgical History  Procedure Laterality Date  . Breast surgery  2011    bilateral implants  . Abdominal hysterectomy  1981    still has 1 ovary, other removed due to scar tissue from hysterectomy   Current Outpatient Prescriptions on File Prior to Visit  Medication Sig Dispense Refill  . acyclovir (ZOVIRAX) 400 MG tablet TAKE 1 TABLET BY MOUTH EVERY DAY  90 tablet  2    . metaxalone (SKELAXIN) 800 MG tablet Take 0.5-1 tablets (400-800 mg total) by mouth 3 (three) times daily as needed for pain.  10 tablet  0  . predniSONE (DELTASONE) 10 MG tablet 6-5-4-3-2-1 tabs po qd x taper 6d  21 tablet  0  . sertraline (ZOLOFT) 100 MG tablet Take 1.5 tablets (150 mg total) by mouth daily.  135 tablet  3  . triamcinolone cream (KENALOG) 0.1 % Apply 1 application topically 3 (three) times daily as needed.  85 g  1  . zolpidem (AMBIEN) 10 MG tablet TAKE 1 TABLET BY MOUTH EVERY NIGHT AT BEDTIME AS NEEDED FOR SLEEP  30 tablet  1   No current facility-administered medications on file prior to visit.   No Known Allergies  History   Social History  . Marital Status: Single    Spouse Name: N/A    Number of Children: N/A  . Years of Education: N/A   Occupational History  . OFFICE MANAGER    Social History Main Topics  . Smoking status: Never Smoker   . Smokeless tobacco: Not on file  . Alcohol Use: No  . Drug Use: No  . Sexual Activity: Yes    Partners: Male    Birth Control/ Protection: Post-menopausal, Surgical     Comment: NUMBER OF SEX PARTNERS IN THE LAST 12 - 1  Other Topics Concern  . Not on file   Social History Narrative  . No narrative on file   Immunization History  Administered Date(s) Administered  . Tdap 05/06/2010   Review of Systems  Constitutional: Positive for diaphoresis (due to hot flashes) and fatigue. Negative for chills.  Gastrointestinal: Negative for abdominal pain.  Genitourinary: Negative for dysuria, vaginal discharge, vaginal pain and dyspareunia.  All other systems reviewed and are negative.    BP 110/69  Pulse 72  Temp(Src) 98.5 F (36.9 C)  Resp 16  Ht 5' 3.5" (1.613 m)  Wt 119 lb (53.978 kg)  BMI 20.75 kg/m2  SpO2 97% Objective:   Physical Exam  Nursing note and vitals reviewed. Constitutional: She is oriented to person, place, and time. She appears well-developed and well-nourished. No distress.  HENT:   Head: Normocephalic and atraumatic.  Right Ear: Tympanic membrane normal.  Left Ear: Tympanic membrane normal.  Nose: Nose normal.  Mouth/Throat: Oropharynx is clear and moist and mucous membranes are normal.  Eyes: EOM are normal.  Neck: Neck supple.  Cardiovascular: Normal rate, regular rhythm, S1 normal and S2 normal.   Pulmonary/Chest: Effort normal and breath sounds normal. No respiratory distress.  Genitourinary: No breast swelling, tenderness, discharge or bleeding.  Musculoskeletal: Normal range of motion.  Neurological: She is alert and oriented to person, place, and time.  Skin: Skin is warm and dry.  Few moles on back that appear benign   Psychiatric: She has a normal mood and affect. Her behavior is normal.      Assessment & Plan:   Annual physical exam - Plan: POCT urinalysis dipstick, Vit D  25 hydroxy (rtn osteoporosis monitoring), CBC, Comprehensive metabolic panel, TSH, Lipid panel - pt reminded to call breast center to sched mammogram - last was 2 yrs prev. No further paps needed due to hysterectomy (still has ovaries). Colonoscopy due 2018. tdap done 2012  Insomnia - uses prn ambien very sparingly  Herpes  Depression - Plan: sertraline (ZOLOFT) 100 MG tablet - doing great at current dose for a long time  Uncontrolled excessive sleepiness while driving - Plan: Ambulatory referral to Neurology  Meds ordered this encounter  Medications  . acyclovir (ZOVIRAX) 400 MG tablet    Sig: TAKE 1 TABLET BY MOUTH EVERY DAY    Dispense:  90 tablet    Refill:  3  . sertraline (ZOLOFT) 100 MG tablet    Sig: Take 1.5 tablets (150 mg total) by mouth daily.    Dispense:  135 tablet    Refill:  3  . zolpidem (AMBIEN) 10 MG tablet    Sig: TAKE 1/2 - 1 TABLET BY MOUTH AT BEDTIME AS NEEDED FOR SLEEP    Dispense:  30 tablet    Refill:  1    I personally performed the services described in this documentation, which was scribed in my presence. The recorded information has been  reviewed and considered, and addended by me as needed.  Norberto SorensonEva Jonika Critz, MD MPH

## 2013-06-02 NOTE — Patient Instructions (Signed)
REMEMBER TO CALL THE BREAST CENTER AT [facility] TO SCHEDULE YOUR MAMMOGRAM.  Keeping You Healthy  Get These Tests  Blood Pressure- Have your blood pressure checked by your healthcare provider at least once a year.  Normal blood pressure is 120/80.  Weight- Have your body mass index (BMI) calculated to screen for obesity.  BMI is a measure of body fat based on height and weight.  You can calculate your own BMI at https://www.west-esparza.com/  Cholesterol- Have your cholesterol checked every year.  Diabetes- Have your blood sugar checked every year if you have high blood pressure, high cholesterol, a family history of diabetes or if you are overweight.  Pap Smear- Have a pap smear every 1 to 3 years if you have been sexually active.  If you are older than 65 and recent pap smears have been normal you may not need additional pap smears.  In addition, if you have had a hysterectomy  For benign disease additional pap smears are not necessary.  Mammogram-Yearly mammograms are essential for early detection of breast cancer  Screening for Colon Cancer- Colonoscopy starting at age 94. Screening may begin sooner depending on your family history and other health conditions.  Follow up colonoscopy as directed by your Gastroenterologist.  Screening for Osteoporosis- Screening begins at age 63 with bone density scanning, sooner if you are at higher risk for developing Osteoporosis.  Get these medicines  Calcium with Vitamin D- Your body requires 1200-1500 mg of Calcium a day and [phone] IU of Vitamin D a day.  You can only absorb 500 mg of Calcium at a time therefore Calcium must be taken in 2 or 3 separate doses throughout the day.  Hormones- Hormone therapy has been associated with increased risk for certain cancers and heart disease.  Talk to your healthcare provider about if you need relief from menopausal symptoms.  Aspirin- Ask your healthcare provider about taking Aspirin to prevent Heart  Disease and Stroke.  Get these Immuniztions  Flu shot- Every fall  Pneumonia shot- Once after the age of 55; if you are younger ask your healthcare provider if you need a pneumonia shot.  Tetanus- Every ten years.  Zostavax- Once after the age of 27 to prevent shingles.  Take these steps  Don't smoke- Your healthcare provider can help you quit. For tips on how to quit, ask your healthcare provider or go to www.smokefree.gov or call 1-800 QUIT-NOW.  Be physically active- Exercise 5 days a week for a minimum of 30 minutes.  If you are not already physically active, start slow and gradually work up to 30 minutes of moderate physical activity.  Try walking, dancing, bike riding, swimming, etc.  Eat a healthy diet- Eat a variety of healthy foods such as fruits, vegetables, whole grains, low fat milk, low fat cheeses, yogurt, lean meats, chicken, fish, eggs, dried beans, tofu, etc.  For more information go to www.thenutritionsource.org  Dental visit- Brush and floss teeth twice daily; visit your dentist twice a year.  Eye exam- Visit your Optometrist or Ophthalmologist yearly.  Drink alcohol in moderation- Limit alcohol intake to one drink or less a day.  Never drink and drive.  Depression- Your emotional health is as important as your physical health.  If you're feeling down or losing interest in things you normally enjoy, please talk to your healthcare provider.  Seat Belts- can save your life; always wear one  Smoke/Carbon Monoxide detectors- These detectors need to be installed on the appropriate level of  your home.  Replace batteries at least once a year.  Violence- If anyone is threatening or hurting you, please tell your healthcare provider.  Living Will/ Health care power of attorney- Discuss with your healthcare provider and family.

## 2013-06-03 LAB — COMPREHENSIVE METABOLIC PANEL
ALBUMIN: 4 g/dL (ref 3.5–5.2)
ALK PHOS: 72 U/L (ref 39–117)
ALT: 17 U/L (ref 0–35)
AST: 27 U/L (ref 0–37)
BUN: 17 mg/dL (ref 6–23)
CALCIUM: 9.5 mg/dL (ref 8.4–10.5)
CHLORIDE: 103 meq/L (ref 96–112)
CO2: 28 mEq/L (ref 19–32)
Creat: 0.63 mg/dL (ref 0.50–1.10)
Glucose, Bld: 72 mg/dL (ref 70–99)
POTASSIUM: 4.1 meq/L (ref 3.5–5.3)
SODIUM: 141 meq/L (ref 135–145)
TOTAL PROTEIN: 6.8 g/dL (ref 6.0–8.3)
Total Bilirubin: 0.6 mg/dL (ref 0.2–1.2)

## 2013-06-03 LAB — LIPID PANEL
Cholesterol: 230 mg/dL — ABNORMAL HIGH (ref 0–200)
HDL: 74 mg/dL (ref >39)
LDL CALC: 142 mg/dL — AB (ref 0–99)
Total CHOL/HDL Ratio: 3.1 Ratio
Triglycerides: 70 mg/dL (ref <150)
VLDL: 14 mg/dL (ref 0–40)

## 2013-06-03 LAB — VITAMIN D 25 HYDROXY (VIT D DEFICIENCY, FRACTURES): VIT D 25 HYDROXY: 43 ng/mL (ref 30–89)

## 2013-06-03 LAB — TSH: TSH: 2.026 u[IU]/mL (ref 0.350–4.500)

## 2013-06-05 ENCOUNTER — Encounter: Payer: Self-pay | Admitting: Family Medicine

## 2013-06-12 ENCOUNTER — Encounter: Payer: Self-pay | Admitting: Neurology

## 2013-06-12 ENCOUNTER — Encounter (INDEPENDENT_AMBULATORY_CARE_PROVIDER_SITE_OTHER): Payer: Self-pay

## 2013-06-12 ENCOUNTER — Encounter (INDEPENDENT_AMBULATORY_CARE_PROVIDER_SITE_OTHER): Payer: BC Managed Care – PPO | Admitting: Neurology

## 2013-06-19 ENCOUNTER — Encounter: Payer: Self-pay | Admitting: Neurology

## 2013-06-19 ENCOUNTER — Encounter (INDEPENDENT_AMBULATORY_CARE_PROVIDER_SITE_OTHER): Payer: Self-pay

## 2013-06-19 ENCOUNTER — Ambulatory Visit (INDEPENDENT_AMBULATORY_CARE_PROVIDER_SITE_OTHER): Payer: BC Managed Care – PPO | Admitting: Neurology

## 2013-06-19 VITALS — BP 110/65 | HR 65 | Resp 18 | Ht 63.25 in | Wt 120.0 lb

## 2013-06-19 DIAGNOSIS — G4761 Periodic limb movement disorder: Secondary | ICD-10-CM

## 2013-06-19 DIAGNOSIS — R0683 Snoring: Secondary | ICD-10-CM

## 2013-06-19 DIAGNOSIS — R0609 Other forms of dyspnea: Secondary | ICD-10-CM

## 2013-06-19 DIAGNOSIS — G4730 Sleep apnea, unspecified: Secondary | ICD-10-CM

## 2013-06-19 DIAGNOSIS — R0989 Other specified symptoms and signs involving the circulatory and respiratory systems: Secondary | ICD-10-CM

## 2013-06-19 NOTE — Patient Instructions (Signed)
Hypersomnia Hypersomnia usually brings recurrent episodes of excessive daytime sleepiness or prolonged nighttime sleep. It is different than feeling tired due to lack of or interrupted sleep at night. People with hypersomnia are compelled to nap repeatedly during the day. This is often at inappropriate times such as:  At work.  During a meal.  In conversation. These daytime naps usually provide no relief. This disorder typically affects adolescents and young adults. CAUSES  This condition may be caused by:  Another sleep disorder (such as narcolepsy or sleep apnea).  Dysfunction of the autonomic nervous system.  Drug or alcohol abuse.  A physical problem, such as:  A tumor.  Head trauma. This is damage caused by an accident.  Injury to the central nervous system.  Certain medications, or medicine withdrawal.  Medical conditions may contribute to the disorder, including:  Multiple sclerosis.  Depression.  Encephalitis.  Epilepsy.  Obesity.  Some people appear to have a genetic predisposition to this disorder. In others, there is no known cause. SYMPTOMS   Patients often have difficulty waking from a long sleep. They may feel dazed or confused.  Other symptoms may include:  Anxiety.  Increased irritation (inflammation).  Decreased energy.  Restlessness.  Slow thinking.  Slow speech.  Loss of appetite.  Hallucinations.  Memory difficulty.  Tremors, Tics.  Some patients lose the ability to function in family, social, occupational, or other settings. TREATMENT  Treatment is symptomatic in nature. Stimulants and other drugs may be used to treat this disorder. Changes in behavior may help. For example, avoid night work and social activities that delay bed time. Changes in diet may offer some relief. Patients should avoid alcohol and caffeine. PROGNOSIS  The likely outcome (prognosis) for persons with hypersomnia depends on the cause of the disorder.  The disorder itself is not life threatening. But it can have serious consequences. For example, automobile accidents can be caused by falling asleep while driving. The attacks usually continue indefinitely. Document Released: 12/12/2001 Document Revised: 03/16/2011 Document Reviewed: 11/16/2007 [employer] Patient Information 2014 [employer].  

## 2013-06-19 NOTE — Progress Notes (Signed)
[facility]   Provider:  Melvyn Novasarmen  Briana Turner, [place]  Referring Provider: Sherren Turner, Briana Turner, Briana Turner Primary Care Physician:  Briana Turner,Briana, Briana Turner  Chief Complaint  Patient presents with  . New Evaluation    Room 11  . Sleep consult    HPI:  Briana Turner is a 58 y.o. caucasian , right handed, married female , who  Is seen here as a referral  from Dr. Norberto SorensonEva Turner for a sleep consultation.   Mrs. Briana Turner. is seen here today for a first time consultation reporting that she had some uncontrolled excessive daytime drowsiness - but also having isolated night of insomnia ( she has used up to 10 Ambien per month for about 7 years).  The patient reports that for the last 5 years or there about she had an increase daytime sleepiness level. Her main concern is that she feels so sleepy , that  She feels unsafe to drive at times . This is a factor driving on the way to and from work. She reports that about 30 years ago she was in the [employer] for several years and that time was severely sleep deprived. But after she left try me drop she returned to a fairly normal sleep-wake rhythm and was unaware of any major sleep problems until 6 or 7 years ago. Initially she had just used Ambien sporadically -and now is not using Ambien daily . The excessive daytime sleepiness does not correlate necessarily with insomnia the preceding night.  The patient states that she feels in the morning on her way to work but she has difficulty staying awake sometimes she has changed lanes were became drowsy enough to not react appropriately at a stop light. Interestingly she doesn't have the same sleepiness when watching TV or reading. The patient reports that she wakes up at 6:00 in the morning, she relies on her alarm clock. She has to be at work at about 8 AM and on the way to work has started to pick up a cup of coffee but she was never very fond of coffee. She has been playing loud music and writing with the windows  down.   Interestingly, she is wide awake once she reaches her destination but she has significant difficulty staying awake for the drive. Her vehicle is a normal model and unlikely to admit any kind of toxic gases etc. 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 She does leave the building for lunch. She endorsed today the Epworth Sleepiness Scale at 17 points indicating that sitting and reading as a passenger in a car lying down in the afternoon or sitting quietly after lunch are her main sleepiness events. The time to leave work varies greatly between 6 and 9 PM. Sometimes it's already dark when she returns home. She may read or watch TV at all, and she tries to be in bed by about 10 PM. She usually falls asleep within 30 minutes or less. She reports having frequent dreams often very vivid dreams. Usually she has one bathroom break at night.  Her bedroom is dark, quiet and cool, she shares the bed with her husband. Her sleep time is about 6 hours at night, she sometimes has trouble falling back asleep after having been to the bathroom. She has had sleep paralysis in the morning, she reports that even as an a brief nap she enters in stages of feels that she is dreaming.  She does not report any dream intrusion into her awake state.  Her husband has told her she snores, when in supine.   She has been taking Zoloft for about 6-7 years now and initially had trouble falling asleep when she took Zoloft in p.m., therefore she changed it to an a.m. intake time.     Review of Systems: Out of a complete 14 system review, the patient complains of only the following symptoms, and all other reviewed systems are negative.  The Epworth sleepiness score and was endorsed at 17 points and her fatigue severity score at 25 points. The patient does not appear clinically depressed at this time.  A review of laboratory testing that Dr. Elesa MassedEva Turner had already performed  and the patient had no anemia, and normal lipid panel normal metabolic panel normal thyroid function and Vit D  deficiency.  History   Social History  . Marital Status: Married    Spouse Name: [name]    Number of Children: 0  . Years of Education: 12   Occupational History  . OFFICE MANAGER    Social History Main Topics  . Smoking status: Never Smoker   . Smokeless tobacco: Never Used  . Alcohol Use: No  . Drug Use: No  . Sexual Activity: Yes    Partners: Male    Birth Control/ Protection: Post-menopausal, Surgical     Comment: NUMBER OF SEX PARTNERS IN THE LAST 12 - 1   Other Topics Concern  . Not on file   Social History Narrative   Patient is married Briana Turner(Briana Turner) and lives at home with her husband.   Patient does not have any children.   Patient is working full-time.   Patient has a high school education.   Patient is right-handed.   Patient drinks a 16 oz coffee daily, two 16 0z diet cokes daily.          Family History  Problem Relation Age of Onset  . Diabetes Sister   . Heart disease Sister   . Cancer Father     throat    Past Medical History  Diagnosis Date  . Depression   . Insomnia   . Genital HSV     Past Surgical History  Procedure Laterality Date  . Breast surgery  2011    bilateral implants  . Abdominal hysterectomy  1981    still has 1 ovary, other removed due to scar tissue from hysterectomy    Current Outpatient Prescriptions  Medication Sig Dispense Refill  . acyclovir (ZOVIRAX) 400 MG tablet TAKE 1 TABLET BY MOUTH EVERY DAY  90 tablet  3  . sertraline (ZOLOFT) 100 MG tablet Take 1.5 tablets (150 mg total) by mouth daily.  135 tablet  3  . zolpidem (AMBIEN) 10 MG tablet TAKE 1/2 - 1 TABLET BY MOUTH AT BEDTIME AS NEEDED FOR SLEEP  30 tablet  1   No current facility-administered medications for this visit.    Allergies as of 06/19/2013  . (No Known Allergies)    Vitals: BP 110/65  Pulse 65  Resp 18  Ht 5' 3.25" (1.607 m)  Wt 120 lb  (54.432 kg)  BMI 21.08 kg/m2 Last Weight:  Wt Readings from Last 1 Encounters:  06/19/13 120 lb (54.432 kg)   Last Height:   Ht Readings from Last 1 Encounters:  06/19/13 5' 3.25" (1.607 m)    Physical exam:  General: The patient is awake, alert and appears not in acute distress. The patient is  well groomed. Head: Normocephalic, atraumatic. Neck is supple. Mallampati  2, neck circumference ; 13.5  inches.  TMJ click . Cardiovascular:  Regular rate and rhythm, without  murmurs or carotid bruit, and without distended neck veins. Respiratory: Lungs are clear to auscultation. Skin:  Without evidence of edema, or rash Trunk: BMI is normal.  Neurologic exam : The patient is awake and alert, oriented to place and time.   Memory subjective described as intact. There is a normal attention span & concentration ability.  Speech is fluent without dysarthria, dysphonia or aphasia. Mood and affect are appropriate.  Cranial nerves: Pupils are equal and briskly reactive to light. Funduscopic exam without evidence of pallor or edema.  Extraocular movements  in vertical and horizontal planes intact and without nystagmus. Visual fields by finger perimetry are intact. Hearing to finger rub intact.  Facial sensation intact to fine touch. Facial motor strength is symmetric and tongue and uvula move midline.  Motor exam:  Normal tone and normal muscle bulk and symmetric normal strength in all extremities.  Sensory:  Fine touch, pinprick and vibration were tested in all extremities.  Proprioception is tested in the upper extremities only. This was normal.  Coordination: Rapid alternating movements in the fingers/hands is tested and normal. Finger-to-nose maneuver tested and normal without evidence of ataxia, dysmetria or tremor.  Gait and station: Patient walks without assistive device . Deep tendon reflexes: in the  upper and lower extremities are very brisk, symmetric. Babinski maneuver response is  downgoing.   Assessment:  After physical and neurologic examination, review of laboratory studies, imaging, neurophysiology testing and pre-existing records, assessment is  1) snoring and EDS when driving. This is an unusual situation, as she never fell asleep in her office.  Husband witnessed snoring and apneas. And she is restless  Kicking and twitching with hyperreflexia. Epworth 17 ! 2) I will order a sleep study , attended , and a possible PLM disorder needs to be addressed.      Plan:  Treatment plan and additional workup : SPLIT study. RV after sleep test .          '

## 2013-08-21 ENCOUNTER — Other Ambulatory Visit: Payer: Self-pay

## 2013-08-21 DIAGNOSIS — Z1231 Encounter for screening mammogram for malignant neoplasm of breast: Secondary | ICD-10-CM

## 2013-08-31 ENCOUNTER — Ambulatory Visit (INDEPENDENT_AMBULATORY_CARE_PROVIDER_SITE_OTHER): Payer: BC Managed Care – PPO | Admitting: Neurology

## 2013-08-31 DIAGNOSIS — G4761 Periodic limb movement disorder: Secondary | ICD-10-CM

## 2013-08-31 DIAGNOSIS — G4710 Hypersomnia, unspecified: Secondary | ICD-10-CM

## 2013-09-06 ENCOUNTER — Ambulatory Visit
Admission: RE | Admit: 2013-09-06 | Discharge: 2013-09-06 | Disposition: A | Discharge status: Home / Self Care | Payer: BC Managed Care – PPO | Source: Ambulatory Visit

## 2013-09-06 DIAGNOSIS — Z1231 Encounter for screening mammogram for malignant neoplasm of breast: Secondary | ICD-10-CM

## 2013-09-12 ENCOUNTER — Telehealth: Payer: Self-pay | Admitting: *Deleted

## 2013-09-12 NOTE — Telephone Encounter (Signed)
I called the patient to review sleep study results.  Given the findings there was no need for additional sleep studies.  The patient's study concluded that there was no significant sleep disordered breathing.  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  A copy of the sleep study report will be sent to the referring provider.  A copy will be sent to the referring provider.  She is aware to contact the office if she requires a follow-up appointment.

## 2013-09-20 ENCOUNTER — Other Ambulatory Visit: Payer: Self-pay | Admitting: *Deleted

## 2013-09-20 DIAGNOSIS — G4730 Sleep apnea, unspecified: Secondary | ICD-10-CM

## 2013-09-20 DIAGNOSIS — G4761 Periodic limb movement disorder: Secondary | ICD-10-CM

## 2013-09-20 DIAGNOSIS — R0683 Snoring: Secondary | ICD-10-CM

## 2013-09-26 NOTE — Progress Notes (Signed)
Patient left without being seen.

## 2013-12-13 ENCOUNTER — Ambulatory Visit (INDEPENDENT_AMBULATORY_CARE_PROVIDER_SITE_OTHER): Payer: BC Managed Care – PPO | Admitting: Physician Assistant

## 2013-12-13 ENCOUNTER — Ambulatory Visit (INDEPENDENT_AMBULATORY_CARE_PROVIDER_SITE_OTHER): Payer: BC Managed Care – PPO

## 2013-12-13 VITALS — BP 109/70 | HR 68 | Temp 98.5°F | Resp 16 | Ht 63.0 in | Wt 120.0 lb

## 2013-12-13 DIAGNOSIS — M25511 Pain in right shoulder: Secondary | ICD-10-CM

## 2013-12-13 DIAGNOSIS — M47812 Spondylosis without myelopathy or radiculopathy, cervical region: Secondary | ICD-10-CM

## 2013-12-13 DIAGNOSIS — M791 Myalgia, unspecified site: Secondary | ICD-10-CM

## 2013-12-13 DIAGNOSIS — M542 Cervicalgia: Secondary | ICD-10-CM

## 2013-12-13 MED ORDER — CYCLOBENZAPRINE HCL 5 MG PO TABS: 5.0000 mg | ORAL_TABLET | Freq: Three times a day (TID) | ORAL | Status: DC | PRN

## 2013-12-13 MED ORDER — MELOXICAM 15 MG PO TABS
15.0000 mg | ORAL_TABLET | Freq: Every day | ORAL | Status: DC
Start: 2013-12-13 — End: 2014-01-29

## 2013-12-13 NOTE — Progress Notes (Signed)
Subjective:    Patient ID: Briana Turner, female    DOB: April 04, 1955, 58 y.o.   MRN: [id number]  HPI Patient presents with 5 months of left sided neck pain and right shoulder pain. Pain is constant and does not radiate. Initially thought pain was due to stress of working on difficult project, but still having pain even after project is over. No trauma to either area and denies loss of function or sensation. Neck pain 0-5/10 and is generally worse when laying on left side. Shoulder pain is 9/10 and no position makes better or worse. Extremities unaffected. Has tried Aleve, ice, heating pad, massage, and asper cream with minimal and temporary relief. Has never had this pain before.  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    Review of Systems  Constitutional: Negative for activity change.  Musculoskeletal: Positive for myalgias, neck pain and neck stiffness. Negative for back pain, joint swelling, arthralgias and gait problem.  Skin: Negative for color change.  Allergic/Immunologic: Negative for environmental allergies and food allergies.  Neurological: Negative for weakness, numbness and headaches.       Objective:   Physical Exam  Constitutional: She is oriented to person, place, and time. She appears well-developed and well-nourished. No distress.  Blood pressure 109/70, pulse 68, temperature 98.5 F (36.9 C), temperature source Oral, resp. rate 16, height 5\' 3"  (1.6 m), weight 120 lb (54.432 kg), SpO2 99 %.   HENT:  Head: Normocephalic and atraumatic.  Right Ear: External ear normal.  Left Ear: External ear normal.  Neck: Neck supple. No thyromegaly present.  Musculoskeletal: She exhibits tenderness. She exhibits no edema.       Right shoulder: She exhibits pain. She exhibits normal range of motion, no tenderness, no bony tenderness, no swelling, no effusion, no crepitus, no deformity, no laceration, no spasm, normal pulse and normal strength.       Left  shoulder: Normal.       Cervical back: She exhibits decreased range of motion (15-20 degree deficit) and pain. She exhibits no tenderness, no bony tenderness, no swelling, no edema, no deformity, no laceration and no spasm.  Lymphadenopathy:    She has no cervical adenopathy.  Neurological: She is alert and oriented to person, place, and time. She has normal strength and normal reflexes. She displays no atrophy. No cranial nerve deficit or sensory deficit. She exhibits normal muscle tone. Coordination normal.  Skin: Skin is warm and dry. No rash noted. She is not diaphoretic. No erythema. No pallor.   [facility] reading (PRIMARY) by  Dr. Conley RollsLe. Shoulder: No bony abnormalities. Neck: Degenerative changes of cervical spine levels 2-6.       Assessment & Plan:  1. Degenerative arthritis of cervical spine 4. Muscle pain - meloxicam (MOBIC) 15 MG tablet; Take 1 tablet (15 mg total) by mouth daily.  Dispense: 30 tablet; Refill: 1 - cyclobenzaprine (FLEXERIL) 5 MG tablet; Take 1 tablet (5 mg total) by mouth 3 (three) times daily as needed for muscle spasms.  Dispense: 30 tablet; Refill: 1 - meloxicam (MOBIC) 15 MG tablet; Take 1 tablet (15 mg total) by mouth daily.  Dispense: 30 tablet; Refill: 1 - Return to clinic if sx worsen or no improvement. Discussed possible referral to PT.  2. Shoulder pain, right - DG Shoulder Right; Future  3. Neck pain - DG Cervical Spine 2 or 3 views; Future  Briana Ridgeishira Gradyn Shein PA-C  [facility] 12/13/2013 6:49 PM

## 2013-12-13 NOTE — Patient Instructions (Signed)

## 2013-12-26 ENCOUNTER — Other Ambulatory Visit: Payer: Self-pay | Admitting: Family Medicine

## 2013-12-27 NOTE — Telephone Encounter (Signed)
Fine to refill for #30 tabs w/ 1 refill as pt uses very sparingly - please call in rx for pt as I will not be in office to physically sign rx for 1 wk. Thx,   ES

## 2013-12-27 NOTE — Telephone Encounter (Signed)
Please call in as I am not in office to sign, thx, ES

## 2013-12-30 ENCOUNTER — Other Ambulatory Visit: Payer: Self-pay | Admitting: Family Medicine

## 2014-01-04 ENCOUNTER — Other Ambulatory Visit: Payer: Self-pay | Admitting: Family Medicine

## 2014-01-25 ENCOUNTER — Ambulatory Visit (INDEPENDENT_AMBULATORY_CARE_PROVIDER_SITE_OTHER): Payer: BLUE CROSS/BLUE SHIELD | Admitting: Emergency Medicine

## 2014-01-25 VITALS — BP 130/82 | HR 95 | Temp 98.3°F | Resp 16

## 2014-01-25 DIAGNOSIS — K529 Noninfective gastroenteritis and colitis, unspecified: Secondary | ICD-10-CM

## 2014-01-25 DIAGNOSIS — G43009 Migraine without aura, not intractable, without status migrainosus: Secondary | ICD-10-CM

## 2014-01-25 MED ORDER — LOPERAMIDE HCL 2 MG PO TABS: ORAL_TABLET | ORAL | Status: DC

## 2014-01-25 MED ORDER — PROMETHAZINE HCL 25 MG/ML IJ SOLN
25.0000 mg | Freq: Once | INTRAMUSCULAR | Status: AC
  Administered 2014-01-25: 25 mg via INTRAMUSCULAR

## 2014-01-25 MED ORDER — SUMATRIPTAN SUCCINATE 100 MG PO TABS: 100.0000 mg | ORAL_TABLET | Freq: Once | ORAL | Status: AC

## 2014-01-25 MED ORDER — KETOROLAC TROMETHAMINE 60 MG/2ML IM SOLN
60.0000 mg | Freq: Once | INTRAMUSCULAR | Status: AC
  Administered 2014-01-25: 60 mg via INTRAMUSCULAR

## 2014-01-25 MED ORDER — ONDANSETRON 8 MG PO TBDP: 8.0000 mg | ORAL_TABLET | Freq: Three times a day (TID) | ORAL | Status: DC | PRN

## 2014-01-25 NOTE — Progress Notes (Signed)
[facility] [address], [place] [id number] [phone]  Date:  01/25/2014   Name:  Briana Turner   DOB:  03-25-1955   MRN:  [id number]  PCP:  Norberto SorensonSHAW,EVA, MD    Chief Complaint: Nausea; Emesis; and Migraine   History of Present Illness:  Briana Turner is a 59 y.o. very pleasant female patient who presents with the following:  Ill since 0300 with diarrhea.  X8.  The patient has no complaint of blood, mucous, or pus in her stools. Nauseated.  No vomiting No fever or chills.   No cough or coryza. History of migraines and has a migraine tonight that is characteristic of her usual headache. No neuro or visual symptoms Has right eye pain and photophobia. No improvement with over the counter medications or other home remedies.  Denies other complaint or health concern today.   Patient Active Problem List   Diagnosis Date Noted  . ERRONEOUS ENCOUNTER--DISREGARD 09/26/2013  . 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 Insomnia 01/22/2012  . Depression 01/22/2012    Past Medical History  Diagnosis Date  . Depression   . Insomnia   . Genital HSV     Past Surgical History  Procedure Laterality Date  . Breast surgery  2011    bilateral implants  . Abdominal hysterectomy  1981    still has 1 ovary, other removed due to scar tissue from hysterectomy    History  Substance Use Topics  . Smoking status: Never Smoker   . Smokeless tobacco: Never Used  . Alcohol Use: No    Family History  Problem Relation Age of Onset  . Diabetes Sister   . Heart disease Sister   . Cancer Father     throat    No Known Allergies  Medication list has been reviewed and updated.  Current Outpatient Prescriptions on File Prior to Visit  Medication Sig Dispense Refill  . acyclovir (ZOVIRAX) 400 MG tablet TAKE 1 TABLET BY MOUTH EVERY DAY 90 tablet 3  . cyclobenzaprine (FLEXERIL) 5 MG tablet Take 1 tablet (5 mg total) by mouth 3 (three) times daily as needed for muscle spasms. 30 tablet  1  . meloxicam (MOBIC) 15 MG tablet Take 1 tablet (15 mg total) by mouth daily. 30 tablet 1  . sertraline (ZOLOFT) 100 MG tablet Take 1.5 tablets (150 mg total) by mouth daily. 135 tablet 3  . zolpidem (AMBIEN) 10 MG tablet TAKE 1/2 TO 1 TABLET BY MOUTH AT BEDTIME AS NEEDED FOR SLEEP 30 tablet 1   No current facility-administered medications on file prior to visit.    Review of Systems:  As per HPI, otherwise negative.    Physical Examination: Filed Vitals:   01/25/14 2016  BP: 130/82  Pulse: 95  Temp: 98.3 F (36.8 C)  Resp: 16   There were no vitals filed for this visit. There is no weight on file to calculate BMI. Ideal Body Weight:    GEN: WDWN, marked distress, Non-toxic, A & O x 3 HEENT: Atraumatic, Normocephalic. Neck supple. No masses, No LAD.  PRRERLA EOMI Ears and Nose: No external deformity. CV: RRR, No M/G/R. No JVD. No thrill. No extra heart sounds. PULM: CTA B, no wheezes, crackles, rhonchi. No retractions. No resp. distress. No accessory muscle use. ABD: S, NT, ND, +BS. No rebound. No HSM. EXTR: No c/c/e NEURO Normal gait.  PSYCH: Normally interactive. Conversant. Not depressed or anxious appearing.  Calm demeanor.    Assessment and Plan:  Migraine Dehydration secondary to gastroenteritis zofran Imodium imitrex Follow up with dr Clelia Croft Signed,  Phillips Odor, MD

## 2014-01-25 NOTE — Patient Instructions (Signed)

## 2014-01-29 ENCOUNTER — Other Ambulatory Visit: Payer: Self-pay | Admitting: Physician Assistant

## 2014-02-05 ENCOUNTER — Ambulatory Visit (INDEPENDENT_AMBULATORY_CARE_PROVIDER_SITE_OTHER): Payer: BLUE CROSS/BLUE SHIELD | Admitting: Physician Assistant

## 2014-02-05 VITALS — BP 100/80 | HR 92 | Temp 99.0°F | Resp 18 | Ht 63.0 in | Wt 118.0 lb

## 2014-02-05 DIAGNOSIS — R05 Cough: Secondary | ICD-10-CM

## 2014-02-05 DIAGNOSIS — R059 Cough, unspecified: Secondary | ICD-10-CM

## 2014-02-05 DIAGNOSIS — J069 Acute upper respiratory infection, unspecified: Secondary | ICD-10-CM

## 2014-02-05 LAB — POCT CBC
GRANULOCYTE PERCENT: 67.2 % (ref 37–80)
HCT, POC: 37.5 % — AB (ref 37.7–47.9)
HEMOGLOBIN: 12.5 g/dL (ref 12.2–16.2)
Lymph, poc: 1 (ref 0.6–3.4)
MCH, POC: 30.5 pg (ref 27–31.2)
MCHC: 33.2 g/dL (ref 31.8–35.4)
MCV: 91.7 fL (ref 80–97)
MID (cbc): 0.3 (ref 0–0.9)
MPV: 6 fL (ref 0–99.8)
POC Granulocyte: 2.8 (ref 2–6.9)
POC LYMPH %: 25 % (ref 10–50)
POC MID %: 7.8 %M (ref 0–12)
Platelet Count, POC: 210 10*3/uL (ref 142–424)
RBC: 4.09 M/uL (ref 4.04–5.48)
RDW, POC: 12.7 %
WBC: 4.2 10*3/uL — AB (ref 4.6–10.2)

## 2014-02-05 MED ORDER — GUAIFENESIN ER 1200 MG PO TB12: 1.0000 | ORAL_TABLET | Freq: Two times a day (BID) | ORAL | Status: DC | PRN

## 2014-02-05 MED ORDER — IPRATROPIUM BROMIDE 0.03 % NA SOLN: 2.0000 | Freq: Two times a day (BID) | NASAL | Status: DC

## 2014-02-05 MED ORDER — HYDROCOD POLST-CHLORPHEN POLST 10-8 MG/5ML PO LQCR: 5.0000 mL | Freq: Two times a day (BID) | ORAL | Status: DC | PRN

## 2014-02-05 NOTE — Patient Instructions (Signed)
Take mucinex twice a day.  Use nasal spray twice a day. Take cough syrup at night. Stop home cold medications. Drink plenty of water and get plenty of rest - rest your voice as best you can. Return in 7-10 days if your symptoms are not improving.

## 2014-02-05 NOTE — Progress Notes (Signed)
Subjective:    Patient ID: Briana Turner, female    DOB: 1955-07-02, 59 y.o.   MRN: [id number]  HPI  This is a 59 year old female presenting with cough, nasal congestion and myalgias x 3 days. States cough was dry but became productive of yellow sputum today. She is having a hard time sleeping d/t coughing. States her back and chest hurt when she coughs. No SOB, wheezing, nasal discharge. Some sinus pressure. Some sore throat in the mornings. No ear pain. Has felt feverish, but hasn't checked her temperature. Is hoarse, states that usually happens when she gets sick. She reports several co-workers have colds. She has taken sudafed and robitussin with minimal relief.  She is not a smoker and no history of asthma.  Review of Systems  Constitutional: Positive for fever. Negative for chills.  HENT: Positive for congestion, sinus pressure, sore throat and voice change. Negative for ear pain and rhinorrhea.   Eyes: Negative for redness.  Respiratory: Positive for cough. Negative for shortness of breath and wheezing.   Cardiovascular: Positive for chest pain.  Gastrointestinal: Negative for nausea, vomiting and diarrhea.  Musculoskeletal: Positive for myalgias and back pain.  Skin: Negative for rash.  Neurological: Negative for headaches.  Hematological: Negative for adenopathy.  Psychiatric/Behavioral: Positive for sleep disturbance.   Patient Active Problem List   Diagnosis Date Noted  . 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 Insomnia 01/22/2012  . Depression 01/22/2012   Prior to Admission medications   Medication Sig Start Date End Date Taking? Authorizing Provider  acyclovir (ZOVIRAX) 400 MG tablet TAKE 1 TABLET BY MOUTH EVERY DAY 06/02/13  Yes Sherren MochaEva N Shaw, MD  sertraline (ZOLOFT) 100 MG tablet Take 1.5 tablets (150 mg total) by mouth daily. 06/02/13  Yes Sherren MochaEva N Shaw, MD  SUMAtriptan (IMITREX) 100 MG tablet Take 1 tablet (100 mg total) by mouth once. May repeat in 2 hours if headache persists or recurs.  01/25/14  Yes Carmelina DaneJeffery S Anderson, MD  zolpidem (AMBIEN) 10 MG tablet TAKE 1/2 TO 1 TABLET BY MOUTH AT BEDTIME AS NEEDED FOR SLEEP 12/27/13  Yes Sherren MochaEva N Shaw, MD   No Known Allergies  Patient's social and family history were reviewed.     Objective:   Physical Exam  Constitutional: She is oriented to person, place, and time. She appears well-developed and well-nourished. No distress.  HENT:  Head: Normocephalic and atraumatic.  Right Ear: Hearing, tympanic membrane, external ear and ear canal normal.  Left Ear: Hearing, tympanic membrane, external ear and ear canal normal.  Nose: Nose normal. Right sinus exhibits no maxillary sinus tenderness and no frontal sinus tenderness. Left sinus exhibits no maxillary sinus tenderness and no frontal sinus tenderness.  Mouth/Throat: Uvula is midline, oropharynx is clear and moist and mucous membranes are normal.  Voice hoarse  Eyes: Conjunctivae and lids are normal. Right eye exhibits no discharge. Left eye exhibits no discharge. No scleral icterus.  Neck: Trachea normal.  Cardiovascular: Normal rate, regular rhythm, normal heart sounds, intact distal pulses and normal pulses.   No murmur heard. Pulmonary/Chest: Effort normal and breath sounds normal. No respiratory distress. She has no wheezes. She has no rhonchi. She has no rales.  Musculoskeletal: Normal range of motion.  Lymphadenopathy:       Head (right side): No submental, no submandibular, no tonsillar, no preauricular and no posterior auricular adenopathy present.       Head (left side): No submental, no submandibular, no tonsillar, no preauricular and no posterior auricular adenopathy  present.    She has no cervical adenopathy.  Neurological: She is alert and oriented to person, place, and time.  Skin: Skin is warm, dry and intact. No lesion and no rash noted.  Psychiatric: She has a normal mood and affect. Her speech is normal and behavior is normal. Thought content normal.   BP 100/80  mmHg  Pulse 92  Temp(Src) 99 F (37.2 C) (Oral)  Resp 18  Ht  (1.6 m)  Wt 118 lb (53.524 kg)  BMI 20.91 kg/m2  SpO2 98%   Results for orders placed or performed in visit on 02/05/14  POCT CBC  Result Value Ref Range   WBC 4.2 (A) 4.6 - 10.2 K/uL   Lymph, poc 1.0 0.6 - 3.4   POC LYMPH PERCENT 25.0 10 - 50 %L   MID (cbc) 0.3 0 - 0.9   POC MID % 7.8 0 - 12 %M   POC Granulocyte 2.8 2 - 6.9   Granulocyte percent 67.2 37 - 80 %G   RBC 4.09 4.04 - 5.48 M/uL   Hemoglobin 12.5 12.2 - 16.2 g/dL   HCT, POC 16.1 (A) 09.6 - 47.9 %   MCV 91.7 80 - 97 fL   MCH, POC 30.5 27 - 31.2 pg   MCHC 33.2 31.8 - 35.4 g/dL   RDW, POC 04.5 %   Platelet Count, POC 210 142 - 424 K/uL   MPV 6.0 0 - 99.8 fL      Assessment & Plan:  1. Cough 2. Viral URI WBC not elevated. This is likely a viral URI. Focus is on supportive care, see meds prescribed below. She will return in 7-10 days if symptoms are not improving.  - POCT CBC - chlorpheniramine-HYDROcodone (TUSSIONEX PENNKINETIC ER) 10-8 MG/5ML LQCR; Take 5 mLs by mouth every 12 (twelve) hours as needed for cough (cough).  Dispense: 80 mL; Refill: 0 - Guaifenesin (MUCINEX MAXIMUM STRENGTH) 1200 MG TB12; Take 1 tablet (1,200 mg total) by mouth every 12 (twelve) hours as needed.  Dispense: 14 tablet; Refill: 1 - ipratropium (ATROVENT) 0.03 % nasal spray; Place 2 sprays into both nostrils 2 (two) times daily.  Dispense: 30 mL; Refill: 0   Rylan Kaufmann V. Dyke Brackett, MHS [facility]  02/05/2014

## 2014-02-07 ENCOUNTER — Ambulatory Visit (INDEPENDENT_AMBULATORY_CARE_PROVIDER_SITE_OTHER): Payer: BLUE CROSS/BLUE SHIELD

## 2014-02-07 ENCOUNTER — Ambulatory Visit (INDEPENDENT_AMBULATORY_CARE_PROVIDER_SITE_OTHER): Payer: BLUE CROSS/BLUE SHIELD | Admitting: Family Medicine

## 2014-02-07 VITALS — BP 80/60 | HR 71 | Temp 98.1°F | Resp 16 | Ht 63.0 in | Wt 114.8 lb

## 2014-02-07 DIAGNOSIS — R059 Cough, unspecified: Secondary | ICD-10-CM

## 2014-02-07 DIAGNOSIS — R05 Cough: Secondary | ICD-10-CM

## 2014-02-07 DIAGNOSIS — J209 Acute bronchitis, unspecified: Secondary | ICD-10-CM

## 2014-02-07 DIAGNOSIS — E860 Dehydration: Secondary | ICD-10-CM

## 2014-02-07 LAB — COMPREHENSIVE METABOLIC PANEL
ALT: 35 U/L (ref 0–35)
AST: 44 U/L — ABNORMAL HIGH (ref 0–37)
Albumin: 3.7 g/dL (ref 3.5–5.2)
Alkaline Phosphatase: 62 U/L (ref 39–117)
BUN: 27 mg/dL — ABNORMAL HIGH (ref 6–23)
CO2: 25 mEq/L (ref 19–32)
Calcium: 8.8 mg/dL (ref 8.4–10.5)
Chloride: 99 mEq/L (ref 96–112)
Creat: 0.61 mg/dL (ref 0.50–1.10)
Glucose, Bld: 83 mg/dL (ref 70–99)
Potassium: 4.1 mEq/L (ref 3.5–5.3)
Sodium: 138 mEq/L (ref 135–145)
Total Bilirubin: 0.3 mg/dL (ref 0.2–1.2)
Total Protein: 6.7 g/dL (ref 6.0–8.3)

## 2014-02-07 LAB — POCT CBC
Granulocyte percent: 71.8 %G (ref 37–80)
HCT, POC: 40.3 % (ref 37.7–47.9)
Hemoglobin: 13.2 g/dL (ref 12.2–16.2)
Lymph, poc: 1 (ref 0.6–3.4)
MCH, POC: 30 pg (ref 27–31.2)
MCHC: 32.9 g/dL (ref 31.8–35.4)
MCV: 91.1 fL (ref 80–97)
MID (cbc): 0.2 (ref 0–0.9)
MPV: 7.3 fL (ref 0–99.8)
POC Granulocyte: 3.1 (ref 2–6.9)
POC LYMPH PERCENT: 23.7 %L (ref 10–50)
POC MID %: 4.5 %M (ref 0–12)
Platelet Count, POC: 180 10*3/uL (ref 142–424)
RBC: 4.42 M/uL (ref 4.04–5.48)
RDW, POC: 12.4 %
WBC: 4.3 10*3/uL — AB (ref 4.6–10.2)

## 2014-02-07 MED ORDER — AZITHROMYCIN 250 MG PO TABS: ORAL_TABLET | ORAL | Status: DC

## 2014-02-07 MED ORDER — BENZONATATE 200 MG PO CAPS: 200.0000 mg | ORAL_CAPSULE | Freq: Two times a day (BID) | ORAL | Status: DC | PRN

## 2014-02-07 NOTE — Progress Notes (Addendum)
Subjective:  This chart was scribed for Briana Sidle, MD, by Briana Turner, at [facility].  This patient was seen in room 5 and the patient's care was started at 12:55 PM.     Patient ID: Briana Turner, female    DOB: [birth year], 59 y.o.   MRN: [id number]  HPI  HPI Comments: Briana Turner is a 59 y.o. female who [facility] for a cough, fatigue, sore throat, nausea and back pain onset four days ago.  Patient was here two days ago and notes that her nausea has gotten worse and has no appetite.  Patient notes she has not been coughing as much as she was on Monday. Took a mucinex, nasal spray and cough medicine last night to alleviate her symptoms but notes of no relief. Patient has only been drinking water and not eating much. Per husband, he states that she only had 2 yogurts this morning. Patient does not smoke.     She denies vomitting, diarrhea, abdominal pain, headache, fever.  Patient is an Print production planner.   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 Authorizing Provider  acyclovir (ZOVIRAX) 400 MG tablet TAKE 1 TABLET BY MOUTH EVERY DAY 06/02/13  Yes Sherren Mocha, MD  chlorpheniramine-HYDROcodone [facility] PENNKINETIC ER) 10-8 MG/5ML LQCR Take 5 mLs by mouth every 12 (twelve) hours as needed for cough (cough). 02/05/14  Yes Lanier Clam V, PA-C  Guaifenesin (MUCINEX MAXIMUM STRENGTH) 1200 MG TB12 Take 1 tablet (1,200 mg total) by mouth every 12 (twelve) hours as  needed. 02/05/14  Yes Lanier Clam V, PA-C  ipratropium (ATROVENT) 0.03 % nasal spray Place 2 sprays into both nostrils 2 (two) times daily. 02/05/14  Yes Lanier Clam V, PA-C  sertraline (ZOLOFT) 100 MG tablet Take 1.5 tablets (150 mg total) by mouth daily. 06/02/13  Yes Sherren Mocha, MD  SUMAtriptan (IMITREX) 100 MG tablet Take 1 tablet (100 mg total) by mouth once. May repeat in 2 hours if headache persists or recurs. 01/25/14  Yes Carmelina Dane, MD  zolpidem (AMBIEN) 10 MG tablet TAKE 1/2 TO 1 TABLET BY MOUTH AT BEDTIME AS NEEDED FOR SLEEP 12/27/13  Yes Sherren Mocha, MD   History   Social History   Marital Status: Married    Spouse Name: N/A    Number of Children: 0   Years of Education: 12   Occupational History   OFFICE MANAGER    Social History Main Topics   Smoking status: Never Smoker    Smokeless tobacco: Never Used   Alcohol Use: No   Drug Use: No   Sexual Activity:    Partners: Male    Copy: Post-menopausal, Surgical     Comment: NUMBER OF SEX PARTNERS IN THE LAST 12 - 1   Other Topics Concern   Not on file   Social History Narrative   Patient is married Briana Turner) and lives  at home with her husband.   Patient does not have any children.   Patient is working full-time.   Patient has a high school education.   Patient is right-handed.   Patient drinks a 16 oz coffee daily, two 16 0z diet cokes daily.          Review of Systems  Constitutional: Positive for fatigue. Negative for fever and chills.  HENT: Positive for sore throat.   Respiratory: Positive for cough.   Gastrointestinal: Positive for nausea. Negative for vomiting, abdominal pain and diarrhea.  Musculoskeletal: Positive for back pain.  Neurological: Negative for headaches.      Objective:   Physical Exam Filed Vitals:   02/07/14 1232  BP: 80/60  Pulse: 71  Temp: 98.1 F (36.7 C)  TempSrc: Oral  Resp: 16  Height: 5\' 3"  (1.6 m)  Weight: 114 lb 12.8 oz (52.073 kg)    SpO2: 100%   General appearance: That of an acutely ill thin woman who gets dizzy when she tries to sit up  HEENT: Patient has scattered telangiectasias on her left cheek Neck: Supple no adenopathy Chest: Faint rales left chest Heart: Regular and rapid Abdomen: Soft and nontender Results for orders placed or performed in visit on 02/07/14  POCT CBC  Result Value Ref Range   WBC 4.3 (A) 4.6 - 10.2 K/uL   Lymph, poc 1.0 0.6 - 3.4   POC LYMPH PERCENT 23.7 10 - 50 %L   MID (cbc) 0.2 0 - 0.9   POC MID % 4.5 0 - 12 %M   POC Granulocyte 3.1 2 - 6.9   Granulocyte percent 71.8 37 - 80 %G   RBC 4.42 4.04 - 5.48 M/uL   Hemoglobin 13.2 12.2 - 16.2 g/dL   HCT, POC 13.240.3 44.037.7 - 47.9 %   MCV 91.1 80 - 97 fL   MCH, POC 30.0 27 - 31.2 pg   MCHC 32.9 31.8 - 35.4 g/dL   RDW, POC 10.212.4 %   Platelet Count, POC 180 142 - 424 K/uL   MPV 7.3 0 - 99.8 fL   Patient seems much improved after IV fluids. She said she still did feel better.    Assessment & Plan:    This chart was scribed in my presence and reviewed by me personally.    ICD-9-CM ICD-10-CM   1. Dehydration 276.51 E86.0 azithromycin (ZITHROMAX Z-PAK) 250 MG tablet     benzonatate (TESSALON) 200 MG capsule  2. Cough 786.2 R05 POCT CBC     Comprehensive metabolic panel     DG Chest 2 View     DG Chest 2 View     azithromycin (ZITHROMAX Z-PAK) 250 MG tablet     benzonatate (TESSALON) 200 MG capsule  3. Acute bronchitis, unspecified organism 466.0 J20.9 azithromycin (ZITHROMAX Z-PAK) 250 MG tablet     benzonatate (TESSALON) 200 MG capsule     Signed, Briana SidleKurt Lauenstein, MD

## 2014-02-09 ENCOUNTER — Ambulatory Visit (INDEPENDENT_AMBULATORY_CARE_PROVIDER_SITE_OTHER): Payer: BLUE CROSS/BLUE SHIELD | Admitting: Family Medicine

## 2014-02-09 VITALS — BP 110/60 | HR 70 | Temp 98.4°F | Resp 16 | Ht 63.0 in | Wt 115.8 lb

## 2014-02-09 DIAGNOSIS — R197 Diarrhea, unspecified: Secondary | ICD-10-CM

## 2014-02-09 DIAGNOSIS — R05 Cough: Secondary | ICD-10-CM

## 2014-02-09 DIAGNOSIS — R059 Cough, unspecified: Secondary | ICD-10-CM

## 2014-02-09 NOTE — Patient Instructions (Addendum)
Add probiotic once daily (eg Culturelle)  Results for orders placed or performed in visit on 02/07/14  Comprehensive metabolic panel  Result Value Ref Range   Sodium 138 135 - 145 mEq/L   Potassium 4.1 3.5 - 5.3 mEq/L   Chloride 99 96 - 112 mEq/L   CO2 25 19 - 32 mEq/L   Glucose, Bld 83 70 - 99 mg/dL   BUN 27 (H) 6 - 23 mg/dL   Creat 0.860.61 5.780.50 - 4.691.10 mg/dL   Total Bilirubin 0.3 0.2 - 1.2 mg/dL   Alkaline Phosphatase 62 39 - 117 U/L   AST 44 (H) 0 - 37 U/L   ALT 35 0 - 35 U/L   Total Protein 6.7 6.0 - 8.3 g/dL   Albumin 3.7 3.5 - 5.2 g/dL   Calcium 8.8 8.4 - 62.910.5 mg/dL  POCT CBC  Result Value Ref Range   WBC 4.3 (A) 4.6 - 10.2 K/uL   Lymph, poc 1.0 0.6 - 3.4   POC LYMPH PERCENT 23.7 10 - 50 %L   MID (cbc) 0.2 0 - 0.9   POC MID % 4.5 0 - 12 %M   POC Granulocyte 3.1 2 - 6.9   Granulocyte percent 71.8 37 - 80 %G   RBC 4.42 4.04 - 5.48 M/uL   Hemoglobin 13.2 12.2 - 16.2 g/dL   HCT, POC 52.840.3 41.337.7 - 47.9 %   MCV 91.1 80 - 97 fL   MCH, POC 30.0 27 - 31.2 pg   MCHC 32.9 31.8 - 35.4 g/dL   RDW, POC 24.412.4 %   Platelet Count, POC 180 142 - 424 K/uL   MPV 7.3 0 - 99.8 fL

## 2014-02-09 NOTE — Progress Notes (Signed)
° °  Subjective:    Patient ID: Briana Turner, female    DOB: 1955-06-24, 59 y.o.   MRN: [id number] This chart was scribed for Elvina SidleKurt Lauenstein, MD by Littie Deedsichard Sun, Medical Scribe. This patient was seen in Room 9 and the patient's care was started at 1:26 PM.   HPI HPI Comments: Briana Fusiaula Twiford is a 59 y.o. female who presents to the [facility] for a follow-up. Patient states she has been feeling better since last time, but she still does not have her energy/strength back. She still has cough and diarrhea with occasional blood in stool. The cough has not been keeping her awake at night. Patient also states she has been having abdominal pain ("stomachache") and flank pain that started yesterday. She has been taking the Z-pack, and will finish her course in 2 days. She has not been taking imodium. Patient started back on her normal medications today. She has not taken probiotics before. Patient does not remember who her gastroenterologist is.  Patient also reports having several spots on her back and front torso.  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  Note from 02/07/14: Briana Fusiaula Septer is a 59 y.o. female who [facility] for a cough, fatigue, sore throat, nausea and back pain onset four days ago. Patient was here two days ago and notes that her nausea has gotten worse and has no appetite. Patient notes she has not been coughing as much as she was on Monday. Took a mucinex, nasal spray and cough medicine last night to alleviate her symptoms but notes of no relief. Patient has only been drinking water and not eating much. Per husband, he states that she only had 2 yogurts this morning. Patient does not smoke.  She denies vomitting, diarrhea, abdominal pain, headache, fever. Patient is an Print production planneroffice manager.    Review of Systems  Constitutional: Positive for fatigue.  Respiratory: Positive for cough.   Gastrointestinal: Positive for  abdominal pain, diarrhea and blood in stool.  Genitourinary: Positive for flank pain.  Skin: Positive for rash.       Objective:   Physical Exam CONSTITUTIONAL: Well developed HEAD: Normocephalic/atraumatic EYES: EOM/PERRL ENMT: Mucous membranes moist NECK: supple no meningeal signs SPINE: entire spine nontender CV: S1/S2 noted, no murmurs/rubs/gallops noted LUNGS: Lungs are clear to auscultation bilaterally, no apparent distress. Patient does have a very congested cough however ABDOMEN: Hyperactive bowel sounds. Nontender without HSM GU: no cva tenderness NEURO: Pt is awake/alert, moves all extremitiesx4 EXTREMITIES: pulses normal, full ROM SKIN: warm, color normal PSYCH: no abnormalities of mood noted        Assessment & Plan:  Pickup some probiotics, continue the current medication, continue pushing fluids  This chart was scribed in my presence and reviewed by me personally.    ICD-9-CM ICD-10-CM   1. Diarrhea 787.91 R19.7 Ambulatory referral to Gastroenterology  2. Cough 786.2 R05      Signed, Elvina SidleKurt Lauenstein, MD

## 2014-03-02 ENCOUNTER — Other Ambulatory Visit: Payer: Self-pay | Admitting: Physician Assistant

## 2014-03-02 ENCOUNTER — Encounter: Payer: Self-pay | Admitting: *Deleted

## 2014-06-18 ENCOUNTER — Ambulatory Visit (INDEPENDENT_AMBULATORY_CARE_PROVIDER_SITE_OTHER): Payer: BLUE CROSS/BLUE SHIELD | Admitting: Physician Assistant

## 2014-06-18 VITALS — BP 108/68 | HR 78 | Temp 98.3°F | Resp 18 | Ht 63.75 in | Wt 116.2 lb

## 2014-06-18 DIAGNOSIS — R1030 Lower abdominal pain, unspecified: Secondary | ICD-10-CM

## 2014-06-18 DIAGNOSIS — R110 Nausea: Secondary | ICD-10-CM | POA: Diagnosis not present

## 2014-06-18 DIAGNOSIS — R197 Diarrhea, unspecified: Secondary | ICD-10-CM

## 2014-06-18 DIAGNOSIS — K529 Noninfective gastroenteritis and colitis, unspecified: Secondary | ICD-10-CM | POA: Diagnosis not present

## 2014-06-18 LAB — POCT UA - MICROSCOPIC ONLY
CASTS, UR, LPF, POC: NEGATIVE
Crystals, Ur, HPF, POC: NEGATIVE
YEAST UA: NEGATIVE

## 2014-06-18 LAB — POCT URINALYSIS DIPSTICK
GLUCOSE UA: NEGATIVE
KETONES UA: NEGATIVE
Leukocytes, UA: NEGATIVE
Nitrite, UA: NEGATIVE
SPEC GRAV UA: 1.015
Urobilinogen, UA: 0.2
pH, UA: 5.5

## 2014-06-18 LAB — POCT CBC
Granulocyte percent: 80.7 %G — AB (ref 37–80)
HCT, POC: 41.6 % (ref 37.7–47.9)
HEMOGLOBIN: 14.3 g/dL (ref 12.2–16.2)
Lymph, poc: 1 (ref 0.6–3.4)
MCH: 30.4 pg (ref 27–31.2)
MCHC: 34.4 g/dL (ref 31.8–35.4)
MCV: 88.5 fL (ref 80–97)
MID (cbc): 0.5 (ref 0–0.9)
MPV: 6.6 fL (ref 0–99.8)
POC Granulocyte: 6.5 (ref 2–6.9)
POC LYMPH PERCENT: 12.8 %L (ref 10–50)
POC MID %: 6.5 %M (ref 0–12)
Platelet Count, POC: 254 10*3/uL (ref 142–424)
RBC: 4.7 M/uL (ref 4.04–5.48)
RDW, POC: 12.2 %
WBC: 8 10*3/uL (ref 4.6–10.2)

## 2014-06-18 LAB — COMPREHENSIVE METABOLIC PANEL
ALT: 29 U/L (ref 0–35)
AST: 43 U/L — AB (ref 0–37)
Albumin: 4.4 g/dL (ref 3.5–5.2)
Alkaline Phosphatase: 91 U/L (ref 39–117)
BILIRUBIN TOTAL: 0.4 mg/dL (ref 0.2–1.2)
BUN: 22 mg/dL (ref 6–23)
CO2: 26 mEq/L (ref 19–32)
Calcium: 9.4 mg/dL (ref 8.4–10.5)
Chloride: 102 mEq/L (ref 96–112)
Creat: 0.76 mg/dL (ref 0.50–1.10)
Glucose, Bld: 89 mg/dL (ref 70–99)
Potassium: 3.9 mEq/L (ref 3.5–5.3)
SODIUM: 134 meq/L — AB (ref 135–145)
TOTAL PROTEIN: 7.2 g/dL (ref 6.0–8.3)

## 2014-06-18 MED ORDER — ONDANSETRON 4 MG PO TBDP: 4.0000 mg | ORAL_TABLET | Freq: Three times a day (TID) | ORAL | Status: DC | PRN

## 2014-06-18 MED ORDER — ONDANSETRON 4 MG PO TBDP
4.0000 mg | ORAL_TABLET | Freq: Once | ORAL | Status: AC
  Administered 2014-06-18: 4 mg via ORAL

## 2014-06-18 NOTE — Progress Notes (Signed)
[facility] [address] [place] [address] [phone]  Date:  06/18/2014   Name:  Briana Turner   DOB:  [birth year]   MRN:  [id number]  PCP:  Norberto Sorenson, MD    Chief Complaint: Nausea; Diarrhea; and Abdominal Pain   History of Present Illness:  This is a 59 y.o. female with PMH depression and insomnia who is presenting with nausea and diarrhea. Diarrhea started 3 days ago. Having diarrhea 5-6 times a day. Diarrhea is watery and brown - no blood or pus. Nausea started this mornings, no vomiting. She has been having lower abdominal pain x 1 week. Abdominal pain described as cramping and sharp. Denies fever or chills. Has been drinking water. Reports this morning she got up to go to work but felt light headed like she was going to black out. No recent abx in past 3 months. No recent travel. No sick contacts. She had a similar illness 5-6 months ago. Dr. Milus Glazier recommended that she start taking a probiotic daily to prevent GI illnesses. States she never started this.   Review of Systems:  Review of Systems  Gastrointestinal: Positive for nausea, abdominal pain and diarrhea. Negative for vomiting, blood in stool and rectal pain.  Genitourinary: Negative for dysuria.  Musculoskeletal: Negative for back pain.  Skin: Negative for rash.  Neurological: Positive for light-headedness.  Hematological: Negative for adenopathy.    Patient Active Problem List   Diagnosis Date Noted  . 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 Insomnia 01/22/2012  . Depression 01/22/2012    Prior to Admission medications   Medication Sig Start Date End Date Taking? Authorizing Provider  acyclovir (ZOVIRAX) 400 MG tablet TAKE 1 TABLET BY MOUTH EVERY DAY 06/02/13  Yes Sherren Mocha, MD  meloxicam (MOBIC) 15 MG tablet TAKE 1 TABLET BY MOUTH EVERY DAY 03/04/14  Yes Sherren Mocha, MD  sertraline (ZOLOFT) 100 MG tablet Take 1.5 tablets (150 mg total) by mouth daily. 06/02/13  Yes Sherren Mocha, MD  SUMAtriptan (IMITREX)  100 MG tablet Take 1 tablet (100 mg total) by mouth once. May repeat in 2 hours if headache persists or recurs. 01/25/14  Yes Carmelina Dane, MD  zolpidem (AMBIEN) 10 MG tablet TAKE 1/2 TO 1 TABLET BY MOUTH AT BEDTIME AS NEEDED FOR SLEEP 12/27/13  Yes Sherren Mocha, MD    No Known Allergies  Past Surgical History  Procedure Laterality Date  . Breast surgery  2011    bilateral implants  . Abdominal hysterectomy  1981    still has 1 ovary, other removed due to scar tissue from hysterectomy    History  Substance Use Topics  . Smoking status: Never Smoker   . Smokeless tobacco: Never Used  . Alcohol Use: No    Family History  Problem Relation Age of Onset  . Diabetes Sister   . Heart disease Sister   . Cancer Father     throat    Medication list has been reviewed and updated.  Physical Examination:  Physical Exam  Constitutional: She is oriented to person, place, and time. She appears well-developed and well-nourished. No distress.  HENT:  Head: Normocephalic and atraumatic.  Right Ear: Hearing normal.  Left Ear: Hearing normal.  Nose: Nose normal.  Eyes: Conjunctivae and lids are normal. Right eye exhibits no discharge. Left eye exhibits no discharge. No scleral icterus.  Cardiovascular: Normal rate, regular rhythm, normal heart sounds and normal pulses.   No murmur heard. Pulmonary/Chest: Effort normal  and breath sounds normal. No respiratory distress. She has no wheezes. She has no rhonchi. She has no rales.  Abdominal: Soft. Normal appearance and bowel sounds are normal. There is tenderness (lower abdomen, worse on L compared to R). There is no rigidity, no rebound, no guarding and no CVA tenderness.  Musculoskeletal: Normal range of motion.  Neurological: She is alert and oriented to person, place, and time.  Skin: Skin is warm, dry and intact. No lesion and no rash noted.  Psychiatric: She has a normal mood and affect. Her speech is normal and behavior is normal.  Thought content normal.   BP 108/68 mmHg  Pulse 78  Temp(Src) 98.3 F (36.8 C) (Oral)  Resp 18  Ht 5' 3.75" (1.619 m)  Wt 116 lb 3.2 oz (52.708 kg)  BMI 20.11 kg/m2  SpO2 98%  Results for orders placed or performed in visit on 06/18/14  POCT UA - Microscopic Only  Result Value Ref Range   WBC, Ur, HPF, POC 2-6    RBC, urine, microscopic 0-2    Bacteria, U Microscopic trace    Mucus, UA large    Epithelial cells, urine per micros 0-5    Crystals, Ur, HPF, POC neg    Casts, Ur, LPF, POC neg    Yeast, UA neg   POCT urinalysis dipstick  Result Value Ref Range   Color, UA yellow    Clarity, UA clear    Glucose, UA neg    Bilirubin, UA small    Ketones, UA neg    Spec Grav, UA 1.015    Blood, UA tr-intact    pH, UA 5.5    Protein, UA trace    Urobilinogen, UA 0.2    Nitrite, UA neg    Leukocytes, UA Negative   POCT CBC  Result Value Ref Range   WBC 8.0 4.6 - 10.2 K/uL   Lymph, poc 1.0 0.6 - 3.4   POC LYMPH PERCENT 12.8 10 - 50 %L   MID (cbc) 0.5 0 - 0.9   POC MID % 6.5 0 - 12 %M   POC Granulocyte 6.5 2 - 6.9   Granulocyte percent 80.7 (A) 37 - 80 %G   RBC 4.70 4.04 - 5.48 M/uL   Hemoglobin 14.3 12.2 - 16.2 g/dL   HCT, POC 16.1 09.6 - 47.9 %   MCV 88.5 80 - 97 fL   MCH, POC 30.4 27 - 31.2 pg   MCHC 34.4 31.8 - 35.4 g/dL   RDW, POC 04.5 %   Platelet Count, POC 254 142 - 424 K/uL   MPV 6.6 0 - 99.8 fL    Assessment and Plan:  1. Gastroenteritis 2. Diarrhea 3. Lower abdominal pain 4. Nausea without vomiting CBC wnl. UA neg. Pt's symptoms improve in office with zofran and 1 L IVF. Sent home with equipment for stool specimen. She will return to office if develops blood/pus in stool or if diarrhea persisting >1 week. Prescribed zofran for nausea. CMP pending. Counseled on hydration and BRAT diet. Return in 7-10 days if symptoms not improving. - ondansetron (ZOFRAN ODT) 4 MG disintegrating tablet; Take 1 tablet (4 mg total) by mouth every 8 (eight) hours as needed  for nausea or vomiting.  Dispense: 20 tablet; Refill: 0 - Stool culture - Ova and parasite examination - POCT CBC - Comprehensive metabolic panel - POCT UA - Microscopic Only - POCT urinalysis dipstick - ondansetron (ZOFRAN-ODT) disintegrating tablet 4 mg; Take 1 tablet (4 mg total) by  mouth once.   [place] Brackett, MHS [facility]  06/18/2014

## 2014-06-18 NOTE — Patient Instructions (Signed)
Return with stool specimen, esp if diarrhea persists beyond 1 week or if you start having blood or pus in stool. Focus on staying hydrated - suck on ice chips to get hydrated slowly. Bland diet when able to start eating again. zofran for nausea. Start probiotic daily to prevent bowel infections. Return if not getting better in 7-10 days.  Food Choices to Help Relieve Diarrhea When you have diarrhea, the foods you eat and your eating habits are very important. Choosing the right foods and drinks can help relieve diarrhea. Also, because diarrhea can last up to 7 days, you need to replace lost fluids and electrolytes (such as sodium, potassium, and chloride) in order to help prevent dehydration.  WHAT GENERAL GUIDELINES DO I NEED TO FOLLOW?  Slowly drink 1 cup (8 oz) of fluid for each episode of diarrhea. If you are getting enough fluid, your urine will be clear or pale yellow.  Eat starchy foods. Some good choices include white rice, white toast, pasta, low-fiber cereal, baked potatoes (without the skin), saltine crackers, and bagels.  Avoid large servings of any cooked vegetables.  Limit fruit to two servings per day. A serving is  cup or 1 small piece.  Choose foods with less than 2 g of fiber per serving.  Limit fats to less than 8 tsp (38 g) per day.  Avoid fried foods.  Eat foods that have probiotics in them. Probiotics can be found in certain dairy products.  Avoid foods and beverages that may increase the speed at which food moves through the stomach and intestines (gastrointestinal tract). Things to avoid include:  High-fiber foods, such as dried fruit, raw fruits and vegetables, nuts, seeds, and whole grain foods.  Spicy foods and high-fat foods.  Foods and beverages sweetened with high-fructose corn syrup, honey, or sugar alcohols such as xylitol, sorbitol, and mannitol. WHAT FOODS ARE RECOMMENDED? Grains White rice. White, [place], or pita breads (fresh or toasted),  including plain rolls, buns, or bagels. White pasta. Saltine, soda, or graham crackers. Pretzels. Low-fiber cereal. Cooked cereals made with water (such as cornmeal, farina, or cream cereals). Plain muffins. Matzo. Melba toast. Zwieback.  Vegetables Potatoes (without the skin). Strained tomato and vegetable juices. Most well-cooked and canned vegetables without seeds. Tender lettuce. Fruits Cooked or canned applesauce, apricots, cherries, fruit cocktail, grapefruit, peaches, pears, or plums. Fresh bananas, apples without skin, cherries, grapes, cantaloupe, grapefruit, peaches, oranges, or plums.  Meat and Other Protein Products Baked or boiled chicken. Eggs. Tofu. Fish. Seafood. Smooth peanut butter. Ground or well-cooked tender beef, ham, veal, lamb, pork, or poultry.  Dairy Plain yogurt, kefir, and unsweetened liquid yogurt. Lactose-free milk, buttermilk, or soy milk. Plain hard cheese. Beverages Sport drinks. Clear broths. Diluted fruit juices (except prune). Regular, caffeine-free sodas such as ginger ale. Water. Decaffeinated teas. Oral rehydration solutions. Sugar-free beverages not sweetened with sugar alcohols. Other Bouillon, broth, or soups made from recommended foods.  The items listed above may not be a complete list of recommended foods or beverages. Contact your dietitian for more options. WHAT FOODS ARE NOT RECOMMENDED? Grains Whole grain, whole wheat, bran, or rye breads, rolls, pastas, crackers, and cereals. Wild or brown rice. Cereals that contain more than 2 g of fiber per serving. Corn tortillas or taco shells. Cooked or dry oatmeal. Granola. Popcorn. Vegetables Raw vegetables. Cabbage, broccoli, Brussels sprouts, artichokes, baked beans, beet greens, corn, kale, legumes, peas, sweet potatoes, and yams. Potato skins. Cooked spinach and cabbage. Fruits Dried fruit, including raisins and dates.  Raw fruits. Stewed or dried prunes. Fresh apples with skin, apricots, mangoes, pears,  raspberries, and strawberries.  Meat and Other Protein Products Chunky peanut butter. Nuts and seeds. Beans and lentils. Tomasa BlaseBacon.  Dairy High-fat cheeses. Milk, chocolate milk, and beverages made with milk, such as milk shakes. Cream. Ice cream. Sweets and Desserts Sweet rolls, doughnuts, and sweet breads. Pancakes and waffles. Fats and Oils Butter. Cream sauces. Margarine. Salad oils. Plain salad dressings. Olives. Avocados.  Beverages Caffeinated beverages (such as coffee, tea, soda, or energy drinks). Alcoholic beverages. Fruit juices with pulp. Prune juice. Soft drinks sweetened with high-fructose corn syrup or sugar alcohols. Other Coconut. Hot sauce. Chili powder. Mayonnaise. Gravy. Cream-based or milk-based soups.  The items listed above may not be a complete list of foods and beverages to avoid. Contact your dietitian for more information. WHAT SHOULD I DO IF I BECOME DEHYDRATED? Diarrhea can sometimes lead to dehydration. Signs of dehydration include dark urine and dry mouth and skin. If you think you are dehydrated, you should rehydrate with an oral rehydration solution. These solutions can be purchased at pharmacies, retail stores, or online.  Drink -1 cup (120-240 mL) of oral rehydration solution each time you have an episode of diarrhea. If drinking this amount makes your diarrhea worse, try drinking smaller amounts more often. For example, drink 1-3 tsp (5-15 mL) every 5-10 minutes.  A general rule for staying hydrated is to drink 1-2 L of fluid per day. Talk to your health care provider about the specific amount you should be drinking each day. Drink enough fluids to keep your urine clear or pale yellow. Document Released: 03/14/2003 Document Revised: 12/27/2012 Document Reviewed: 11/14/2012 [facility] Patient Information 2015 [employer], [place]. This information is not intended to replace advice given to you by your health care provider. Make sure you discuss any questions you have with  your health care provider.

## 2014-06-21 ENCOUNTER — Telehealth: Payer: Self-pay

## 2014-06-21 NOTE — Telephone Encounter (Signed)
Pt was seen on 6/13 by Benny Lennert. Her diarrhea has cleared up; however, she now has blood in her urine. She is not sure what to do because she is in [place]. Please advise at [phone]

## 2014-06-22 NOTE — Telephone Encounter (Signed)
Left message on pt's voicemail to go to an urgent care in [place].

## 2014-06-22 NOTE — Telephone Encounter (Signed)
Pt checking on status of this previous message Please advise

## 2014-08-10 ENCOUNTER — Other Ambulatory Visit: Payer: Self-pay | Admitting: Family Medicine

## 2014-08-27 ENCOUNTER — Other Ambulatory Visit: Payer: Self-pay | Admitting: Family Medicine

## 2014-09-05 ENCOUNTER — Other Ambulatory Visit: Payer: Self-pay | Admitting: Family Medicine

## 2014-09-24 ENCOUNTER — Other Ambulatory Visit: Payer: Self-pay | Admitting: Family Medicine

## 2014-09-24 ENCOUNTER — Ambulatory Visit (INDEPENDENT_AMBULATORY_CARE_PROVIDER_SITE_OTHER): Payer: BLUE CROSS/BLUE SHIELD | Admitting: Family Medicine

## 2014-09-24 ENCOUNTER — Emergency Department (HOSPITAL_COMMUNITY)
Admission: EM | Admit: 2014-09-24 | Discharge: 2014-09-24 | Disposition: A | Discharge status: Home / Self Care | Payer: BLUE CROSS/BLUE SHIELD | Attending: Emergency Medicine | Admitting: Emergency Medicine

## 2014-09-24 ENCOUNTER — Encounter (HOSPITAL_COMMUNITY): Payer: Self-pay | Admitting: Emergency Medicine

## 2014-09-24 VITALS — BP 140/78 | HR 89 | Temp 98.3°F | Resp 18 | Ht 63.0 in | Wt 120.0 lb

## 2014-09-24 DIAGNOSIS — Z79899 Other long term (current) drug therapy: Secondary | ICD-10-CM | POA: Diagnosis not present

## 2014-09-24 DIAGNOSIS — Z8619 Personal history of other infectious and parasitic diseases: Secondary | ICD-10-CM | POA: Insufficient documentation

## 2014-09-24 DIAGNOSIS — Z791 Long term (current) use of non-steroidal anti-inflammatories (NSAID): Secondary | ICD-10-CM | POA: Insufficient documentation

## 2014-09-24 DIAGNOSIS — F329 Major depressive disorder, single episode, unspecified: Secondary | ICD-10-CM | POA: Diagnosis not present

## 2014-09-24 DIAGNOSIS — F32A Depression, unspecified: Secondary | ICD-10-CM

## 2014-09-24 DIAGNOSIS — G4700 Insomnia, unspecified: Secondary | ICD-10-CM | POA: Diagnosis not present

## 2014-09-24 LAB — RAPID URINE DRUG SCREEN, HOSP PERFORMED
Amphetamines: NOT DETECTED
BENZODIAZEPINES: NOT DETECTED
Barbiturates: NOT DETECTED
COCAINE: NOT DETECTED
Opiates: NOT DETECTED
Tetrahydrocannabinol: NOT DETECTED

## 2014-09-24 LAB — BASIC METABOLIC PANEL
ANION GAP: 8 (ref 5–15)
BUN: 17 mg/dL (ref 6–20)
CALCIUM: 9.4 mg/dL (ref 8.9–10.3)
CO2: 25 mmol/L (ref 22–32)
CREATININE: 0.52 mg/dL (ref 0.44–1.00)
Chloride: 106 mmol/L (ref 101–111)
Glucose, Bld: 94 mg/dL (ref 65–99)
Potassium: 4 mmol/L (ref 3.5–5.1)
Sodium: 139 mmol/L (ref 135–145)

## 2014-09-24 LAB — ETHANOL

## 2014-09-24 LAB — SALICYLATE LEVEL: Salicylate Lvl: <4 mg/dL (ref 2.8–30.0)

## 2014-09-24 LAB — CBC WITH DIFFERENTIAL/PLATELET
BASOS ABS: 0 10*3/uL (ref 0.0–0.1)
BASOS PCT: 1 %
EOS ABS: 0.1 10*3/uL (ref 0.0–0.7)
EOS PCT: 2 %
HCT: 38.2 % (ref 36.0–46.0)
Hemoglobin: 12.9 g/dL (ref 12.0–15.0)
Lymphocytes Relative: 32 %
Lymphs Abs: 2.2 10*3/uL (ref 0.7–4.0)
MCH: 30.9 pg (ref 26.0–34.0)
MCHC: 33.8 g/dL (ref 30.0–36.0)
MCV: 91.6 fL (ref 78.0–100.0)
MONO ABS: 0.6 10*3/uL (ref 0.1–1.0)
MONOS PCT: 9 %
Neutro Abs: 4 10*3/uL (ref 1.7–7.7)
Neutrophils Relative %: 56 %
PLATELETS: 276 10*3/uL (ref 150–400)
RBC: 4.17 MIL/uL (ref 3.87–5.11)
RDW: 13 % (ref 11.5–15.5)
WBC: 7 10*3/uL (ref 4.0–10.5)

## 2014-09-24 LAB — ACETAMINOPHEN LEVEL

## 2014-09-24 NOTE — ED Notes (Signed)
Awake. Verbally responsive. Resp even and unlabored. ABC's intact. No behavior problems noted. NAD noted. Spouse at bedside. Pt eating meal.

## 2014-09-24 NOTE — ED Notes (Signed)
Awake. Verbally responsive. A/O x4. Resp even and unlabored. No audible adventitious breath sounds noted. ABC's intact.  
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
## 2014-09-24 NOTE — ED Provider Notes (Signed)
CSN: [id number]     Arrival date & time 09/24/14  1235 History   First MD Initiated Contact with Patient 09/24/14 1547     Chief Complaint  Patient presents with  . Depression      HPI Patient with Hx of depression c/o depression episode onset Friday. Pt states that this happens about once per year but usually she can "pull herself out of it." Pt tearful, states she cannot overcome depression this time. Denies SI/HI/AVH. Reports taking Zoloft, denies change in medication, denies recent loss. States her job is stressful and work stress is the cause of this episode. Past Medical History  Diagnosis Date  . Depression   . Insomnia   . Genital HSV    Past Surgical History  Procedure Laterality Date  . Breast surgery  2011    bilateral implants  . Abdominal hysterectomy  1981    still has 1 ovary, other removed due to scar tissue from hysterectomy   Family History  Problem Relation Age of Onset  . Diabetes Sister   . Heart disease Sister   . Cancer Father     throat   Social History  Substance Use Topics  . Smoking status: Never Smoker   . Smokeless tobacco: Never Used  . Alcohol Use: No   OB History    No data available     Review of Systems  All other systems reviewed and are negative.     Allergies  Review of patient's allergies indicates no known allergies.  Home Medications   Prior to Admission medications   Medication Sig Start Date End Date Taking? Authorizing Provider  acyclovir (ZOVIRAX) 400 MG tablet TAKE 1 TABLET BY MOUTH EVERY DAY Patient taking differently: Take 400 mg by mouth daily.  08/28/14  Yes Chelle Jeffery, PA-C  CALCIUM PO Take 1 tablet by mouth daily.   Yes Historical Provider, MD  meloxicam (MOBIC) 15 MG tablet Take 1 tablet (15 mg total) by mouth daily. PATIENT NEEDS MED CHECK UP FOR ADDITIONAL REFILLS 08/13/14  Yes Sherren Mocha, MD  Multiple Vitamin (MULTIVITAMIN WITH MINERALS) TABS tablet Take 1 tablet by mouth daily.   Yes Historical  Provider, MD  sertraline (ZOLOFT) 100 MG tablet TAKE 1 AND 1/2 TABLETS DAILY Patient taking differently: Take 100 mg by mouth daily.  08/28/14  Yes Chelle Jeffery, PA-C  zolpidem (AMBIEN) 10 MG tablet TAKE 1/2 TO 1 TABLET BY MOUTH EVERY DAY AT BEDTIME 09/09/14  Yes Sherren Mocha, MD  ondansetron (ZOFRAN ODT) 4 MG disintegrating tablet Take 1 tablet (4 mg total) by mouth every 8 (eight) hours as needed for nausea or vomiting. Patient not taking: Reported on 09/24/2014 06/18/14   Dorna Leitz, PA-C  SUMAtriptan (IMITREX) 100 MG tablet Take 1 tablet (100 mg total) by mouth once. May repeat in 2 hours if headache persists or recurs. Patient not taking: Reported on 09/24/2014 01/25/14   Carmelina Dane, MD   BP 140/86 mmHg  Pulse 73  Temp(Src) 98.3 F (36.8 C) (Oral)  Resp 20  Wt 120 lb (54.432 kg)  SpO2 99% Physical Exam  Constitutional: She is oriented to person, place, and time. She appears well-developed and well-nourished. No distress.  HENT:  Head: Normocephalic and atraumatic.  Eyes: Pupils are equal, round, and reactive to light.  Neck: Normal range of motion.  Cardiovascular: Normal rate and intact distal pulses.   Pulmonary/Chest: No respiratory distress.  Abdominal: Normal appearance. She exhibits no distension.  Musculoskeletal: Normal range  of motion.  Neurological: She is alert and oriented to person, place, and time. No cranial nerve deficit.  Skin: Skin is warm and dry. No rash noted.  Psychiatric: Her behavior is normal. Judgment normal. She is not actively hallucinating. Cognition and memory are normal. She exhibits a depressed mood. She expresses no homicidal and no suicidal ideation.  Nursing note and vitals reviewed.   ED Course  Procedures (including critical care time) Labs Review Labs Reviewed  ACETAMINOPHEN LEVEL - Abnormal; Notable for the following:    Acetaminophen (Tylenol), Serum <10 (*)    All other components within normal limits  CBC WITH  DIFFERENTIAL/PLATELET  BASIC METABOLIC PANEL  URINE RAPID DRUG SCREEN, HOSP PERFORMED  SALICYLATE LEVEL  ETHANOL    Imaging Review No results found. I have personally reviewed and evaluated these images and lab results as part of my medical decision-making.  Patient was seen and evaluated by RTS.  Felt not to be homicidal or suicidal was given resources for follow-up and agreed to follow-up as outpatient.  Discharge in stable condition.  MDM   Final diagnoses:  Depression        Nelva Nay, MD 09/24/14 [phone]

## 2014-09-24 NOTE — Clinical Social Work Note (Signed)
CSW called and spoke with MD at [facility] regarding pt.  MD relayed information regarding pt being compliant with her medications, she is not currently  Suicidal or Homicidal.  She is medically cleared.  CSW called and spoke with RN asking to have tele-psyche machine set up for pt assessment  .Briana Buba, LCSW [facility] triage

## 2014-09-24 NOTE — ED Notes (Signed)
Awake. Verbally responsive. Resp even and unlabored. ABC's intact. No behavior problems noted. Pt given crackers, peanut butter and diet coke. NAD noted. Spouse at bedside.

## 2014-09-24 NOTE — Clinical Social Work Note (Signed)
CSW called and spoke with MD to let him know about pt disposition  CSW called and spoke with the RN to let her know about pt disposition  .Elray Buba, LCSW [facility] triage

## 2014-09-24 NOTE — ED Notes (Signed)
Pt ambulated to Conference room next to room 5 to speak with Psych Dr.

## 2014-09-24 NOTE — ED Notes (Signed)
Awake. Verbally responsive. Resp even and unlabored. ABC's intact. No behavior problems noted. NAD noted. Spouse at bedside.

## 2014-09-24 NOTE — Discharge Instructions (Signed)

## 2014-09-24 NOTE — ED Notes (Signed)
Pt reported feeling depression. Pt very tearful when talking to her. Spouse at bedside. Pt denies SI/HI and visual/audible hallucinations.

## 2014-09-24 NOTE — ED Notes (Signed)
Patient with Hx of depression c/o depression episode onset Friday. Pt states that this happens about once per year but usually she can "pull herself out of it." Pt tearful, states she cannot overcome depression this time. Denies SI/HI/AVH. Reports taking Zoloft, denies change in medication, denies recent loss. States her job is stressful and work stress is the cause of this episode.

## 2014-09-24 NOTE — Progress Notes (Addendum)
This chart was scribed for [name], MD by [facility], medical scribe at [facility].The patient was seen in exam room 02 and the patient's care was started at 11:43 AM.  Patient ID: Briana Turner MRN: [id number], DOB: 1955/02/27, 59 y.o. Date of Encounter: 09/24/2014  Primary Physician: Norberto Sorenson, MD  Chief Complaint:  Chief Complaint  Patient presents with   Anxiety    crying all weekend    Depression    pt taking medication   HPI:  Briana Turner is a 59 y.o. female who presents to [facility] complaining of a anxiety attack due to work. Symptoms began this weekend. Uncontrollable crying all weekend.Typically has a breakdown every year and she is able to pull herself together, but she has been unable to this incident. Currently taking Zoloft. 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 Boss at work has not been understanding. 5 years until she retires.   Past Medical History  Diagnosis Date   Depression    Insomnia    Genital HSV      Home Meds: Prior to Admission medications   Medication Sig Start Date End Date Taking? Authorizing Provider  acyclovir (ZOVIRAX) 400 MG tablet TAKE 1 TABLET BY MOUTH EVERY DAY 08/28/14  Yes Chelle Jeffery, PA-C  meloxicam (MOBIC) 15 MG tablet Take 1 tablet (15 mg total) by mouth daily. PATIENT NEEDS MED CHECK UP FOR ADDITIONAL REFILLS 08/13/14  Yes Sherren Mocha, MD  sertraline (ZOLOFT) 100 MG tablet TAKE 1 AND 1/2 TABLETS DAILY 08/28/14  Yes Chelle Jeffery, PA-C  zolpidem (AMBIEN) 10 MG tablet TAKE 1/2 TO 1 TABLET BY MOUTH EVERY DAY AT BEDTIME 09/09/14  Yes Sherren Mocha, MD  ondansetron (ZOFRAN ODT) 4 MG disintegrating tablet Take 1 tablet (4 mg total) by mouth every 8 (eight) hours as needed for nausea or vomiting. Patient not taking: Reported on 09/24/2014 06/18/14   Dorna Leitz, PA-C  SUMAtriptan (IMITREX) 100 MG tablet Take 1 tablet (100 mg total) by mouth once. May repeat in 2 hours if  headache persists or recurs. Patient not taking: Reported on 09/24/2014 01/25/14   Carmelina Dane, MD    Allergies: No Known Allergies  Social History   Social History   Marital Status: Married    Spouse Name: N/A   Number of Children: 0   Years of Education: 12   Occupational History   OFFICE MANAGER    Social History Main Topics   Smoking status: Never Smoker    Smokeless tobacco: Never Used   Alcohol Use: No   Drug Use: No   Sexual Activity:    Partners: Male    Copy: Post-menopausal, Surgical     Comment: NUMBER OF SEX PARTNERS IN THE LAST 12 - 1   Other Topics Concern   Not on file   Social History Narrative   Patient is married [name]) and lives at home with her husband.   Patient does not have any children.   Patient is working full-time.   Patient has a high school education.   Patient is right-handed.   Patient drinks a 16 oz coffee daily, two 16 0z diet cokes daily.          Review of Systems: Constitutional: negative for chills, fever, night sweats, weight changes, or fatigue  HEENT: negative for vision changes, hearing loss, congestion, rhinorrhea, ST, epistaxis, or sinus pressure Cardiovascular: negative for chest pain or palpitations  Respiratory: negative for hemoptysis, wheezing, shortness of breath, or cough Abdominal: negative for abdominal pain, nausea, vomiting, diarrhea, or constipation Dermatological: negative for rash Neurologic: negative for headache, dizziness, or syncope All other systems reviewed and are otherwise negative with the exception to those above and in the HPI.  Physical Exam: Blood pressure 140/78, pulse 89, temperature 98.3 F (36.8 C), temperature source Oral, resp. rate 18, height  (1.6 m), weight 120 lb (54.432 kg), SpO2 97 %., Body mass index is 21.26 kg/(m^2). General: Well developed, well nourished, in no acute distress. Tearful and anxious in the exam room. Head: Normocephalic,  atraumatic, eyes without discharge, sclera non-icteric, nares are without discharge. Bilateral auditory canals clear, TM's are without perforation, pearly grey and translucent with reflective cone of light bilaterally. Oral cavity moist, posterior pharynx without exudate, erythema, peritonsillar abscess, or post nasal drip.  Neck: Supple. No thyromegaly. Full ROM. No lymphadenopathy. Lungs: Clear bilaterally to auscultation without wheezes, rales, or rhonchi. Breathing is unlabored. Heart: RRR with S1 S2. No murmurs, rubs, or gallops appreciated. Abdomen: Soft, non-tender, non-distended with normoactive bowel sounds. No hepatomegaly. No rebound/guarding. No obvious abdominal masses. Msk:  Strength and tone normal for age. Extremities/Skin: Warm and dry. No clubbing or cyanosis. No edema. No rashes or suspicious lesions. Neuro: Alert and oriented X 3. Moves all extremities spontaneously. Gait is normal. CNII-XII grossly in tact. Psych:  Responds to questions appropriately but is crying uncontrollably.    ASSESSMENT AND PLAN:  59 y.o. year old female with acute depressive/anxiety crisis.  She needs urgent evaluation.  I called [facility] and they said they cannot see her in the near future.  We then called [facility] who said they wouldn't see her.  They told us to send patient to [facility] Emergency department. We tried 3 different private offices today and none of them would see this patient.  By signing my name below, I, Nadim Abuhashem, attest that this documentation has been prepared under the direction and in the presence of [name], MD.  Electronically Signed: Conchita Paris, medical scribe. 09/24/2014 11:52 AM.  This chart was scribed in my presence and reviewed by me personally.  Patient and husband were directed to [facility] emergency room for acute evaluation and management.  Signed, [name], MD 09/24/2014 11:52 AM

## 2014-09-24 NOTE — ED Notes (Signed)
Pt, being sent by PCP, d/t chronic anxiety attacks.  PCP would like Pt admitted to [facility].  [facility] directed them to send her to [facility].

## 2014-09-25 ENCOUNTER — Telehealth: Payer: Self-pay

## 2014-09-25 NOTE — Telephone Encounter (Signed)
Pt would like to speak with someone about something that was put in her chart, it needs to be addressed and discussed. Please call [phone]

## 2014-09-25 NOTE — Telephone Encounter (Signed)
Spoke with pt, she states she was at the hospital all day yesterday and wants to know where he suggested her to go. They referred her to the outpatient program. She refused to be admitted. She states she feels ok today.  Do you remember telling her another place she may go? Please advise.

## 2014-09-27 NOTE — Telephone Encounter (Signed)
Spoke with pt, advised her of the office Dr. l suggested.

## 2014-10-23 ENCOUNTER — Ambulatory Visit (INDEPENDENT_AMBULATORY_CARE_PROVIDER_SITE_OTHER): Payer: BLUE CROSS/BLUE SHIELD | Admitting: Physician Assistant

## 2014-10-23 ENCOUNTER — Encounter: Payer: Self-pay | Admitting: Physician Assistant

## 2014-10-23 VITALS — BP 100/70 | HR 74 | Temp 98.1°F | Resp 16 | Ht 63.0 in | Wt 117.2 lb

## 2014-10-23 DIAGNOSIS — Z114 Encounter for screening for human immunodeficiency virus [HIV]: Secondary | ICD-10-CM

## 2014-10-23 DIAGNOSIS — Z1159 Encounter for screening for other viral diseases: Secondary | ICD-10-CM | POA: Diagnosis not present

## 2014-10-23 DIAGNOSIS — Z131 Encounter for screening for diabetes mellitus: Secondary | ICD-10-CM

## 2014-10-23 DIAGNOSIS — G4700 Insomnia, unspecified: Secondary | ICD-10-CM | POA: Diagnosis not present

## 2014-10-23 DIAGNOSIS — Z13220 Encounter for screening for lipoid disorders: Secondary | ICD-10-CM | POA: Diagnosis not present

## 2014-10-23 DIAGNOSIS — F32A Depression, unspecified: Secondary | ICD-10-CM

## 2014-10-23 DIAGNOSIS — Z0000 Encounter for general adult medical examination without abnormal findings: Secondary | ICD-10-CM

## 2014-10-23 DIAGNOSIS — F329 Major depressive disorder, single episode, unspecified: Secondary | ICD-10-CM | POA: Diagnosis not present

## 2014-10-23 DIAGNOSIS — M5030 Other cervical disc degeneration, unspecified cervical region: Secondary | ICD-10-CM | POA: Diagnosis not present

## 2014-10-23 DIAGNOSIS — B009 Herpesviral infection, unspecified: Secondary | ICD-10-CM

## 2014-10-23 LAB — LIPID PANEL
CHOLESTEROL: 261 mg/dL — AB (ref 125–200)
HDL: 81 mg/dL (ref >=46)
LDL Cholesterol: 162 mg/dL — ABNORMAL HIGH (ref <130)
Total CHOL/HDL Ratio: 3.2 Ratio (ref <=5.0)
Triglycerides: 90 mg/dL (ref <150)
VLDL: 18 mg/dL (ref <30)

## 2014-10-23 LAB — CBC WITH DIFFERENTIAL/PLATELET
BASOS ABS: 0.1 10*3/uL (ref 0.0–0.1)
Basophils Relative: 1 % (ref 0–1)
EOS ABS: 0.1 10*3/uL (ref 0.0–0.7)
Eosinophils Relative: 2 % (ref 0–5)
HEMATOCRIT: 41.6 % (ref 36.0–46.0)
HEMOGLOBIN: 14.2 g/dL (ref 12.0–15.0)
LYMPHS ABS: 1.5 10*3/uL (ref 0.7–4.0)
LYMPHS PCT: 21 % (ref 12–46)
MCH: 31.3 pg (ref 26.0–34.0)
MCHC: 34.1 g/dL (ref 30.0–36.0)
MCV: 91.6 fL (ref 78.0–100.0)
MONOS PCT: 11 % (ref 3–12)
MPV: 10.3 fL (ref 8.6–12.4)
Monocytes Absolute: 0.8 10*3/uL (ref 0.1–1.0)
NEUTROS ABS: 4.7 10*3/uL (ref 1.7–7.7)
NEUTROS PCT: 65 % (ref 43–77)
PLATELETS: 247 10*3/uL (ref 150–400)
RBC: 4.54 MIL/uL (ref 3.87–5.11)
RDW: 13.4 % (ref 11.5–15.5)
WBC: 7.3 10*3/uL (ref 4.0–10.5)

## 2014-10-23 LAB — COMPREHENSIVE METABOLIC PANEL
ALK PHOS: 74 U/L (ref 33–130)
ALT: 16 U/L (ref 6–29)
AST: 27 U/L (ref 10–35)
Albumin: 4.8 g/dL (ref 3.6–5.1)
BILIRUBIN TOTAL: 0.5 mg/dL (ref 0.2–1.2)
BUN: 20 mg/dL (ref 7–25)
CALCIUM: 9.7 mg/dL (ref 8.6–10.4)
CO2: 26 mmol/L (ref 20–31)
Chloride: 103 mmol/L (ref 98–110)
Creat: 0.71 mg/dL (ref 0.50–1.05)
GLUCOSE: 78 mg/dL (ref 65–99)
Potassium: 4.2 mmol/L (ref 3.5–5.3)
Sodium: 141 mmol/L (ref 135–146)
TOTAL PROTEIN: 7.5 g/dL (ref 6.1–8.1)

## 2014-10-23 LAB — POCT URINALYSIS DIP (MANUAL ENTRY)
BILIRUBIN UA: NEGATIVE
Blood, UA: NEGATIVE
Glucose, UA: NEGATIVE
Ketones, POC UA: NEGATIVE
NITRITE UA: NEGATIVE
PH UA: 6.5
Protein Ur, POC: NEGATIVE
Spec Grav, UA: 1.01
Urobilinogen, UA: 0.2

## 2014-10-23 MED ORDER — ZOLPIDEM TARTRATE 10 MG PO TABS: ORAL_TABLET | ORAL | Status: DC

## 2014-10-23 MED ORDER — SERTRALINE HCL 100 MG PO TABS: ORAL_TABLET | ORAL | Status: AC

## 2014-10-23 MED ORDER — ACYCLOVIR 400 MG PO TABS: ORAL_TABLET | ORAL | Status: AC

## 2014-10-23 MED ORDER — MELOXICAM 15 MG PO TABS: ORAL_TABLET | ORAL | Status: DC

## 2014-10-23 NOTE — Patient Instructions (Signed)
Your meds have been sent to the pharmacy. Try cutting meloxicam in half and see if that controls your pain. I will call you with lab results. Schedule your mammogram. Make appt with therapist. Return as needed.

## 2014-10-23 NOTE — Progress Notes (Signed)
   Subjective:    Patient ID: Briana Turner, female    DOB: 1956/01/04, 59 y.o.   MRN: [id number]  HPI    Review of Systems  Constitutional: Negative.   HENT: Positive for sore throat.   Eyes: Negative.   Respiratory: Negative.   Cardiovascular: Negative.   Gastrointestinal: Negative.   Endocrine: Negative.   Genitourinary: Negative.   Musculoskeletal: Negative.   Skin: Negative.   Allergic/Immunologic: Negative.   Neurological: Negative.   Hematological: Negative.   Psychiatric/Behavioral: Negative.        Objective:   Physical Exam        Assessment & Plan:

## 2014-10-23 NOTE — Progress Notes (Signed)
[facility] [address], [place] [address] [phone]  Date:  10/23/2014   Name:  Briana Turner   DOB:  July 13, 1955   MRN:  [id number]  PCP:  [name], MD    Chief Complaint: Annual Exam and Medication Refill   History of Present Illness:  This is a 59 y.o. female with PMH depression, insomnia, HSV who is presenting for CPE.  Pt was in the ED 9/19 for acute depressive/anxiety episode. She was sent there by Dr. Milus Glazier. She was seen by behavioral health and was not admitted. She states every year she has a "breakdown" where she comes home crying from work. She usually gets better after a few hours. The episode in September, she was crying for 3 straight days. She had no suicide ideation. She is stating today that she feels much better. She increased her zoloft dose from 100 mg to 150 mg QD and states this has helped a lot. She has not yet contacted a therapist d/t her busy schedule at work but plans to make appt soon. She has information on therapists provided by Dr. Bing Matter at [facility].   Taking 2.5 mg ambien at night. Getting 6-6.5 hours at night.  Takes mobic 15 mg for cervical DDD - since starting mobic pain has been well controlled.  Takes imitrex as needed for headaches.  Acyclovir daily for herpes  LMP: hysterectomy at age 53. Has left ovary still Last pap: 2009, normal Sexual history: sexually active with husband Immunizations: tdap 2012, declined flu Dentist: reguarly Eye: no change in vision Diet/Exercise: walks on treadmill 3-4 times a week Fam hx: father esophageal cancer at age 51 and also had CVA, Mother died of MI at age 13. Also had DM. Sister died from diabetic coma at age 75. Tobacco/alcohol/substance use: no/no/no Mammogram: 09/2013  Colonoscopy: 02/2014 Dexa: never. Never had any fractures.  1 year ago total chol 230, LDL 142, HDL 74  Review of Systems:  Review of Systems See CMA  notes  Patient Active Problem List   Diagnosis Date Noted  . 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 Insomnia 01/22/2012  . Depression 01/22/2012   Prior to Admission medications   Medication Sig Start Date End Date Taking? Authorizing Provider  acyclovir (ZOVIRAX) 400 MG tablet TAKE 1 TABLET BY MOUTH EVERY DAY 10/23/14  Yes Lanier Clam V, PA-C  CALCIUM PO Take 1 tablet by mouth daily.   Yes Historical Provider, MD  meloxicam (MOBIC) 15 MG tablet TAKE 1 TABLET (15 MG) BY MOUTH DAILY 10/23/14  Yes Lanier Clam V, PA-C  Multiple Vitamin (MULTIVITAMIN WITH MINERALS) TABS tablet Take 1 tablet by mouth daily.   Yes Historical Provider, MD  sertraline (ZOLOFT) 100 MG tablet TAKE 1 AND 1/2 TABLETS DAILY 10/23/14  Yes Lanier Clam V, PA-C  SUMAtriptan (IMITREX) 100 MG tablet Take 1 tablet (100 mg total) by mouth once. May repeat in 2 hours if headache persists or recurs. 01/25/14  Yes Carmelina Dane, MD  zolpidem (AMBIEN) 10 MG tablet TAKE 1/2 TO 1 TABLET BY MOUTH EVERY DAY AT BEDTIME 10/23/14  Yes Dorna Leitz, PA-C    No Known Allergies  Past Surgical History  Procedure Laterality Date  . Breast surgery  2011    bilateral implants  . Abdominal hysterectomy  1981    still has 1 ovary, other removed due to scar tissue from hysterectomy    Social History  Substance Use Topics  . Smoking status:  Never Smoker   . Smokeless tobacco: Never Used  . Alcohol Use: No    Family History  Problem Relation Age of Onset  . Diabetes Sister   . Cancer Father     throat  . Hyperlipidemia Father   . Stroke Father   . Diabetes Mother   . Heart disease Mother   . Hyperlipidemia Mother   . Diabetes Sister   . Heart disease Sister     Medication list has been reviewed and updated.  Physical Examination:  Physical Exam  Constitutional: She is oriented to person, place, and time.  HENT:  Head: Normocephalic and atraumatic.  Right Ear: Hearing, tympanic membrane, external ear and ear canal normal.  Left  Ear: Hearing, tympanic membrane, external ear and ear canal normal.  Nose: Nose normal.  Mouth/Throat: Uvula is midline, oropharynx is clear and moist and mucous membranes are normal.  Eyes: Conjunctivae, EOM and lids are normal. Pupils are equal, round, and reactive to light. Right eye exhibits no discharge. Left eye exhibits no discharge. No scleral icterus.  Neck: Trachea normal. Carotid bruit is not present. No thyromegaly present.  Cardiovascular: Normal rate, regular rhythm, normal heart sounds, intact distal pulses and normal pulses.   No murmur heard. Pulmonary/Chest: Effort normal and breath sounds normal. She has no wheezes. She has no rhonchi. She has no rales.  Abdominal: Soft. Normal appearance. There is no tenderness.  Genitourinary: No breast swelling, tenderness, discharge or bleeding.  No breast masses or skin changes  Musculoskeletal: Normal range of motion.  Lymphadenopathy:       Head (right side): No submental, no submandibular, no tonsillar, no preauricular, no posterior auricular and no occipital adenopathy present.       Head (left side): No submental, no submandibular, no tonsillar, no preauricular, no posterior auricular and no occipital adenopathy present.    She has no cervical adenopathy.    She has no axillary adenopathy.       Right: No supraclavicular adenopathy present.       Left: No supraclavicular adenopathy present.  Neurological: She is alert and oriented to person, place, and time. She has normal strength and normal reflexes. No cranial nerve deficit or sensory deficit. Coordination and gait normal.  Skin: Skin is warm, dry and intact. No lesion and no rash noted.  Psychiatric: She has a normal mood and affect. Her speech is normal and behavior is normal. Thought content normal.   BP 100/70 mmHg  Pulse 74  Temp(Src) 98.1 F (36.7 C) (Oral)  Resp 16  Ht 5\' 3"  (1.6 m)  Wt 117 lb 3.2 oz (53.162 kg)  BMI 20.77 kg/m2  SpO2 98%  Results for orders  placed or performed in visit on 10/23/14  POCT urinalysis dipstick  Result Value Ref Range   Color, UA yellow yellow   Clarity, UA clear clear   Glucose, UA negative negative   Bilirubin, UA negative negative   Ketones, POC UA negative negative   Spec Grav, UA 1.010    Blood, UA negative negative   pH, UA 6.5    Protein Ur, POC negative negative   Urobilinogen, UA 0.2    Nitrite, UA Negative Negative   Leukocytes, UA Trace (A) Negative    Assessment and Plan:  1. Annual physical exam Up to date on preventative health - CBC with Differential/Platelet - POCT urinalysis dipstick  2. Screening for diabetes mellitus - Comprehensive metabolic panel  3. Lipid screening - Lipid panel  4. Screening for  HIV (human immunodeficiency virus) - HIV antibody  5. Need for hepatitis C screening test - Hepatitis C antibody  6. Depression Doing better. Advised she make appt with therapist soon. - sertraline (ZOLOFT) 100 MG tablet; TAKE 1 AND 1/2 TABLETS DAILY  Dispense: 135 tablet; Refill: 5  7. Insomnia Doing well on low dose ambien 2.5 mg. Continue. - zolpidem (AMBIEN) 10 MG tablet; TAKE 1/2 TO 1 TABLET BY MOUTH EVERY DAY AT BEDTIME  Dispense: 30 tablet; Refill: 3  8. Herpes - acyclovir (ZOVIRAX) 400 MG tablet; TAKE 1 TABLET BY MOUTH EVERY DAY  Dispense: 90 tablet; Refill: 3  9. DDD (degenerative disc disease), cervical Advised she cut in half and see if this controls her pain and only take 7.5 mg if that works for her. - meloxicam (MOBIC) 15 MG tablet; TAKE 1 TABLET (15 MG) BY MOUTH DAILY  Dispense: 90 tablet; Refill: 1   Senay Sistrunk V. Dyke Brackett, MHS [facility]  10/23/2014

## 2014-10-24 LAB — HIV ANTIBODY (ROUTINE TESTING W REFLEX): HIV: NONREACTIVE

## 2014-10-24 LAB — HEPATITIS C ANTIBODY: HCV AB: NEGATIVE

## 2014-10-24 NOTE — Addendum Note (Signed)
Addended by: Carmelina DaneANDERSON, Takenya Travaglini S on: 10/24/2014 04:33 PM   Modules accepted: Kipp BroodSmartSet

## 2014-10-24 NOTE — Progress Notes (Signed)
  Medical screening examination/treatment/procedure(s) were performed by non-physician practitioner and as supervising physician I was immediately available for consultation/collaboration.     

## 2014-10-25 ENCOUNTER — Other Ambulatory Visit: Payer: Self-pay | Admitting: Physician Assistant

## 2015-03-07 ENCOUNTER — Telehealth: Payer: Self-pay

## 2015-03-07 DIAGNOSIS — E785 Hyperlipidemia, unspecified: Secondary | ICD-10-CM

## 2015-03-07 NOTE — Telephone Encounter (Signed)
The patient is requesting to have a lab order placed by Lanier Clam to recheck her cholesterol.  She started a Mediterranean diet in October after an OV with Joni Reining and would like to see if her cholesterol has improved.  Please advise, thank you.  CB# [phone]

## 2015-03-07 NOTE — Telephone Encounter (Signed)
I have placed future order for lipid panel. She may return at earliest convenience for lab only visit. She should come in the morning, no food after midnight.

## 2015-03-25 ENCOUNTER — Other Ambulatory Visit: Payer: Self-pay

## 2015-03-25 DIAGNOSIS — Z1231 Encounter for screening mammogram for malignant neoplasm of breast: Secondary | ICD-10-CM

## 2015-04-03 ENCOUNTER — Ambulatory Visit
Admission: RE | Admit: 2015-04-03 | Discharge: 2015-04-03 | Disposition: A | Discharge status: Home / Self Care | Payer: BLUE CROSS/BLUE SHIELD | Source: Ambulatory Visit

## 2015-04-03 DIAGNOSIS — Z1231 Encounter for screening mammogram for malignant neoplasm of breast: Secondary | ICD-10-CM

## 2015-04-17 ENCOUNTER — Other Ambulatory Visit: Payer: Self-pay | Admitting: *Deleted

## 2015-04-17 ENCOUNTER — Other Ambulatory Visit (INDEPENDENT_AMBULATORY_CARE_PROVIDER_SITE_OTHER): Payer: BLUE CROSS/BLUE SHIELD

## 2015-04-17 DIAGNOSIS — E785 Hyperlipidemia, unspecified: Secondary | ICD-10-CM

## 2015-04-25 ENCOUNTER — Telehealth: Payer: Self-pay | Admitting: *Deleted

## 2015-04-25 NOTE — Telephone Encounter (Signed)
Lab -- any insight?? Pt came in for lab only visit on 04/17/15 but it doesn't look like the blood got sent to [facility].

## 2015-04-25 NOTE — Telephone Encounter (Signed)
Patient called today wanting to know her lab results from April 12th.  She states that she was a lab only at 104.  However, looking at her chart it says it needs to be collected.  I advised the patient we would look into this and get back with her soon. Patient understood.

## 2015-04-29 ENCOUNTER — Telehealth: Payer: Self-pay

## 2015-04-29 NOTE — Telephone Encounter (Signed)
Pt is checking to see if lab has located her lab work  [facility] [employer] number [phone]

## 2015-04-30 NOTE — Telephone Encounter (Signed)
Labs in your box in dr lounge.

## 2015-04-30 NOTE — Telephone Encounter (Signed)
See other phone message  

## 2015-05-01 NOTE — Telephone Encounter (Signed)
Lipid Panel 04/17/15: Total chol: 207 Triglycerides: 68 HDL: 89 LDL: 104 Total chol/hdl ratio: 2.3  Please call pt and let her know her cholesterol is much improved! Her good cholesterol is still very high, which is great. Bad cholesterol has decreased 60 points. This is great! Continue diet and exercise as she has been doing.

## 2015-05-02 NOTE — Telephone Encounter (Signed)
Briana Turner Her most recent labs are in your box

## 2015-05-02 NOTE — Telephone Encounter (Signed)
Called [facility] because the results that were sent to us were not her lipids results from the 12th so they stated they will refax them

## 2015-05-03 NOTE — Telephone Encounter (Signed)
Those are the exact same results I already reviewed. See my description below and let pt know.

## 2015-07-23 ENCOUNTER — Other Ambulatory Visit: Payer: Self-pay | Admitting: Physician Assistant

## 2015-07-23 DIAGNOSIS — G4700 Insomnia, unspecified: Secondary | ICD-10-CM

## 2015-07-29 NOTE — Telephone Encounter (Signed)
Called in RF. Spoke to pt and advise of need for f/up for more RFs. Pt agreed.

## 2015-11-27 ENCOUNTER — Encounter: Payer: Self-pay | Admitting: Gastroenterology

## 2016-02-10 ENCOUNTER — Other Ambulatory Visit: Payer: Self-pay | Admitting: Physician Assistant

## 2016-02-10 DIAGNOSIS — F329 Major depressive disorder, single episode, unspecified: Secondary | ICD-10-CM

## 2016-02-10 DIAGNOSIS — F32A Depression, unspecified: Secondary | ICD-10-CM

## 2016-05-11 ENCOUNTER — Other Ambulatory Visit: Payer: Self-pay | Admitting: Physician Assistant

## 2016-05-11 DIAGNOSIS — Z1231 Encounter for screening mammogram for malignant neoplasm of breast: Secondary | ICD-10-CM

## 2016-06-12 ENCOUNTER — Ambulatory Visit
Admission: RE | Admit: 2016-06-12 | Discharge: 2016-06-12 | Disposition: A | Discharge status: Home / Self Care | Payer: BLUE CROSS/BLUE SHIELD | Source: Ambulatory Visit | Attending: Physician Assistant | Admitting: Physician Assistant

## 2016-06-12 DIAGNOSIS — Z1231 Encounter for screening mammogram for malignant neoplasm of breast: Secondary | ICD-10-CM

## 2016-06-16 ENCOUNTER — Other Ambulatory Visit: Payer: Self-pay | Admitting: Physician Assistant

## 2016-06-16 DIAGNOSIS — R928 Other abnormal and inconclusive findings on diagnostic imaging of breast: Secondary | ICD-10-CM

## 2016-06-23 ENCOUNTER — Ambulatory Visit
Admission: RE | Admit: 2016-06-23 | Discharge: 2016-06-23 | Disposition: A | Discharge status: Home / Self Care | Payer: BLUE CROSS/BLUE SHIELD | Source: Ambulatory Visit | Attending: Physician Assistant | Admitting: Physician Assistant

## 2016-06-23 DIAGNOSIS — R928 Other abnormal and inconclusive findings on diagnostic imaging of breast: Secondary | ICD-10-CM

## 2017-06-24 ENCOUNTER — Ambulatory Visit: Payer: BLUE CROSS/BLUE SHIELD | Admitting: Sports Medicine

## 2017-06-24 ENCOUNTER — Ambulatory Visit (INDEPENDENT_AMBULATORY_CARE_PROVIDER_SITE_OTHER): Payer: BLUE CROSS/BLUE SHIELD

## 2017-06-24 DIAGNOSIS — M25561 Pain in right knee: Secondary | ICD-10-CM

## 2017-06-24 DIAGNOSIS — M25562 Pain in left knee: Secondary | ICD-10-CM | POA: Diagnosis not present

## 2017-06-24 NOTE — Progress Notes (Signed)
Subjective:    I'm seeing this patient as a consultation for:  Dr. Elvina SidleKurt Lauenstein  CC: Left knee pain  HPI: This is a pleasant and healthy 62 year old female, she has been doing some boxing classes, last Tuesday after a session she noted increasing pain at the posterior lateral corner of her left knee, she does not remember any specific injuries.  She only had mild swelling without mechanical symptoms, unfortunately the pain has persisted, moderate, persistent, localized without radiation, no catching, locking, occasional buckling.  I reviewed the past medical history, family history, social history, surgical history, and allergies today and no changes were needed.  Please see the problem list section below in epic for further details.  Past Medical History: Past Medical History:  Diagnosis Date  . Anxiety   . Depression   . Genital HSV   . Insomnia    Past Surgical History: Past Surgical History:  Procedure Laterality Date  . ABDOMINAL HYSTERECTOMY  1981   still has 1 ovary, other removed due to scar tissue from hysterectomy  . AUGMENTATION MAMMAPLASTY    . BREAST BIOPSY Left 1997   Benign Core  . BREAST CYST ASPIRATION  09/18/2004   Benign cyst asp  . BREAST EXCISIONAL BIOPSY Left 1997   Benign exc bx  . BREAST SURGERY  2011   bilateral implants   Social History: Social History   Socioeconomic History  . Marital status: Married    Spouse name: Not on file  . Number of children: 0  . Years of education: 112  . Highest education level: Not on file  Occupational History  . Occupation: OFFICE MANAGER  Social Needs  . Financial resource strain: Not on file  . Food insecurity:    Worry: Not on file    Inability: Not on file  . Transportation needs:    Medical: Not on file    Non-medical: Not on file  Tobacco Use  . Smoking status: Never Smoker  . Smokeless tobacco: Never Used  Substance and Sexual Activity  . Alcohol use: No  . Drug use: No  . Sexual  activity: Yes    Partners: Male    Birth control/protection: Post-menopausal, Surgical    Comment: NUMBER OF SEX PARTNERS IN THE LAST 12 - 1  Lifestyle  . Physical activity:    Days per week: Not on file    Minutes per session: Not on file  . Stress: Not on file  Relationships  . Social connections:    Talks on phone: Not on file    Gets together: Not on file    Attends religious service: Not on file    Active member of club or organization: Not on file    Attends meetings of clubs or organizations: Not on file    Relationship status: Not on file  Other Topics Concern  . Not on file  Social History Narrative   Patient is married [name](Steve) and lives at home with her husband.   Patient does not have any children.   Patient is working full-time.   Patient has a high school education.   Patient is right-handed.   Patient drinks a 16 oz coffee daily, two 16 0z diet cokes daily.      Patient does exercise.   Family History: Family History  Problem Relation Age of Onset  . Diabetes Sister   . Cancer Father        throat  . Hyperlipidemia Father   . Stroke Father   .  Diabetes Mother   . Heart disease Mother   . Hyperlipidemia Mother   . Diabetes Sister   . Heart disease Sister    Allergies: No Known Allergies Medications: See med rec.  Review of Systems: No headache, visual changes, nausea, vomiting, diarrhea, constipation, dizziness, abdominal pain, skin rash, fevers, chills, night sweats, weight loss, swollen lymph nodes, body aches, joint swelling, muscle aches, chest pain, shortness of breath, mood changes, visual or auditory hallucinations.   Objective:   General: Well Developed, well nourished, and in no acute distress.  Neuro:  Extra-ocular muscles intact, able to move all 4 extremities, sensation grossly intact.  Deep tendon reflexes tested were normal. Psych: Alert and oriented, mood congruent with affect. ENT:  Ears and nose appear unremarkable.  Hearing grossly  normal. Neck: Unremarkable overall appearance, trachea midline.  No visible thyroid enlargement. Eyes: Conjunctivae and lids appear unremarkable.  Pupils equal and round. Skin: Warm and dry, no rashes noted.  Cardiovascular: Pulses palpable, no extremity edema. Left knee: Normal to inspection with no erythema or effusion or obvious bony abnormalities. Tender to palpation at the posterolateral joint line ROM normal in flexion and extension and lower leg rotation. Ligaments with solid consistent endpoints including PCL, LCL, MCL. I do have trouble appreciating her ACL. Negative Mcmurray's and provocative meniscal tests. Non painful patellar compression. Patellar and quadriceps tendons unremarkable. Hamstring and quadriceps strength is normal.  Unofficial bedside ultrasound does show some heterogeneity and hypoechoic lines through the posterior horn of the lateral meniscus.  No joint effusion, no Baker's cyst.  Impression and Recommendations:   This case required medical decision making of moderate complexity.  Acute pain of left knee Pain at the posterior lateral joint line, suspect injury to the posterior horn of the lateral meniscus. Unofficial bedside ultrasound did show some heterogeneity of the posterior horn of the lateral meniscus. No severe mechanical symptoms, treated conservatively, meloxicam, reaction knee brace. Rehab exercises, x-rays. She does a lot of boxing, I do think the twisting motions have injured her, she will cross train in the meantime.   No boxing for now. Return to see me in 1 month, MRI if no better. ___________________________________________ Ihor Austin. Benjamin Stain, M.D., ABFM., CAQSM. Primary Care and Sports Medicine [facility]  Adjunct Instructor of Family Medicine  [employer]

## 2017-06-24 NOTE — Assessment & Plan Note (Signed)
Pain at the posterior lateral joint line, suspect injury to the posterior horn of the lateral meniscus. Unofficial bedside ultrasound did show some heterogeneity of the posterior horn of the lateral meniscus. No severe mechanical symptoms, treated conservatively, meloxicam, reaction knee brace. Rehab exercises, x-rays. She does a lot of boxing, I do think the twisting motions have injured her, she will cross train in the meantime.   No boxing for now. Return to see me in 1 month, MRI if no better.

## 2017-07-20 ENCOUNTER — Encounter: Payer: Self-pay | Admitting: Sports Medicine

## 2017-07-20 ENCOUNTER — Ambulatory Visit: Payer: BLUE CROSS/BLUE SHIELD | Admitting: Sports Medicine

## 2017-07-20 DIAGNOSIS — M25562 Pain in left knee: Secondary | ICD-10-CM

## 2017-07-20 NOTE — Assessment & Plan Note (Signed)
She did have pain at the posterior/lateral joint line, now resolved with reaction knee brace, meloxicam, rehab exercises and crosstraining. I think at this point she can get back to boxing and return to see me as needed.

## 2017-07-20 NOTE — Progress Notes (Signed)
Subjective:    CC: Follow-up  HPI: Briana Turner returns, she has done extremely well with a reaction knee brace, conservative measures and meloxicam, she has no more pain, she is able to twist, walk, box without any discomfort.  I reviewed the past medical history, family history, social history, surgical history, and allergies today and no changes were needed.  Please see the problem list section below in epic for further details.  Past Medical History: Past Medical History:  Diagnosis Date  . Anxiety   . Depression   . Genital HSV   . Insomnia    Past Surgical History: Past Surgical History:  Procedure Laterality Date  . ABDOMINAL HYSTERECTOMY  1981   still has 1 ovary, other removed due to scar tissue from hysterectomy  . AUGMENTATION MAMMAPLASTY    . BREAST BIOPSY Left 1997   Benign Core  . BREAST CYST ASPIRATION  09/18/2004   Benign cyst asp  . BREAST EXCISIONAL BIOPSY Left 1997   Benign exc bx  . BREAST SURGERY  2011   bilateral implants   Social History: Social History   Socioeconomic History  . Marital status: Married    Spouse name: Not on file  . Number of children: 0  . Years of education: 35  . Highest education level: Not on file  Occupational History  . Occupation: OFFICE MANAGER  Social Needs  . Financial resource strain: Not on file  . Food insecurity:    Worry: Not on file    Inability: Not on file  . Transportation needs:    Medical: Not on file    Non-medical: Not on file  Tobacco Use  . Smoking status: Never Smoker  . Smokeless tobacco: Never Used  Substance and Sexual Activity  . Alcohol use: No  . Drug use: No  . Sexual activity: Yes    Partners: Male    Birth control/protection: Post-menopausal, Surgical    Comment: NUMBER OF SEX PARTNERS IN THE LAST 12 - 1  Lifestyle  . Physical activity:    Days per week: Not on file    Minutes per session: Not on file  . Stress: Not on file  Relationships  . Social connections:    Talks on  phone: Not on file    Gets together: Not on file    Attends religious service: Not on file    Active member of club or organization: Not on file    Attends meetings of clubs or organizations: Not on file    Relationship status: Not on file  Other Topics Concern  . Not on file  Social History Narrative   Patient is married [name]) and lives at home with her husband.   Patient does not have any children.   Patient is working full-time.   Patient has a high school education.   Patient is right-handed.   Patient drinks a 16 oz coffee daily, two 16 0z diet cokes daily.      Patient does exercise.   Family History: Family History  Problem Relation Age of Onset  . Diabetes Sister   . Cancer Father        throat  . Hyperlipidemia Father   . Stroke Father   . Diabetes Mother   . Heart disease Mother   . Hyperlipidemia Mother   . Diabetes Sister   . Heart disease Sister    Allergies: No Known Allergies Medications: See med rec.  Review of Systems: No fevers, chills, night sweats, weight loss, chest  pain, or shortness of breath.   Objective:    General: Well Developed, well nourished, and in no acute distress.  Neuro: Alert and oriented x3, extra-ocular muscles intact, sensation grossly intact.  HEENT: Normocephalic, atraumatic, pupils equal round reactive to light, neck supple, no masses, no lymphadenopathy, thyroid nonpalpable.  Skin: Warm and dry, no rashes. Cardiac: Regular rate and rhythm, no murmurs rubs or gallops, no lower extremity edema.  Respiratory: Clear to auscultation bilaterally. Not using accessory muscles, speaking in full sentences. Left knee: Normal to inspection with no erythema or effusion or obvious bony abnormalities. Palpation normal with no warmth or joint line tenderness or patellar tenderness or condyle tenderness. ROM normal in flexion and extension and lower leg rotation. Ligaments with solid consistent endpoints including ACL, PCL, LCL,  MCL. Negative Mcmurray's and provocative meniscal tests. Non painful patellar compression. Patellar and quadriceps tendons unremarkable. Hamstring and quadriceps strength is normal.  Impression and Recommendations:    Acute pain of left knee She did have pain at the posterior/lateral joint line, now resolved with reaction knee brace, meloxicam, rehab exercises and crosstraining. I think at this point she can get back to boxing and return to see me as needed. ___________________________________________ Ihor Austinhomas J. Benjamin Stainhekkekandam, M.D., ABFM., CAQSM. Primary Care and Sports Medicine [facility]  Adjunct Instructor of Family Medicine  [employer]

## 2018-02-08 ENCOUNTER — Other Ambulatory Visit: Payer: Self-pay | Admitting: Physician Assistant

## 2018-02-08 DIAGNOSIS — Z1231 Encounter for screening mammogram for malignant neoplasm of breast: Secondary | ICD-10-CM

## 2018-02-10 ENCOUNTER — Ambulatory Visit (INDEPENDENT_AMBULATORY_CARE_PROVIDER_SITE_OTHER): Payer: BLUE CROSS/BLUE SHIELD | Admitting: Family Medicine

## 2018-02-10 ENCOUNTER — Encounter: Payer: Self-pay | Admitting: Family Medicine

## 2018-02-10 VITALS — BP 89/65 | HR 63 | Ht 63.0 in | Wt 117.0 lb

## 2018-02-10 DIAGNOSIS — G5701 Lesion of sciatic nerve, right lower limb: Secondary | ICD-10-CM

## 2018-02-10 NOTE — Progress Notes (Signed)
Briana Turner is a 63 y.o. female who presents to [facility] today for right buttocks pain.  Velva is a healthy fit and active 63 year old woman.  She notes a 2-week history of severe pain in her right buttocks with pain radiating or referring down the posterior thigh not below the level of the knee.  She cannot recall any specific injury or incident.  She exercises regularly but denies any change in activity.  She notes the pain is located into the right buttocks worse when she sits on her right side and when she stands from a seated position and with ambulation.  Pain is severe at times and interferes with sleep home activities such as cleaning or dressing and significantly interferes with exercise.  Her symptoms are extremely bothersome and interfere with her quality of life.  She has tried rest heat ice and ibuprofen.  These treatment options only helped a little.    ROS:  As above  Exam:  BP (!) 89/65   Pulse 63   Ht 5\' 3"  (1.6 m)   Wt 117 lb (53.1 kg)   BMI 20.73 kg/m  Wt Readings from Last 5 Encounters:  02/10/18 117 lb (53.1 kg)  07/20/17 124 lb (56.2 kg)  06/24/17 123 lb 12.8 oz (56.2 kg)  10/23/14 117 lb 3.2 oz (53.2 kg)  09/24/14 120 lb (54.4 kg)   General: Well Developed, well nourished, and in no acute distress.  Neuro/Psych: Alert and oriented x3, extra-ocular muscles intact, able to move all 4 extremities, sensation grossly intact. Skin: Warm and dry, no rashes noted.  Respiratory: Not using accessory muscles, speaking in full sentences, trachea midline.  Cardiovascular: Pulses palpable, no extremity edema. Abdomen: Does not appear distended. MSK:  L-spine: Nontender to spinal midline.  Normal lumbar motion.  Right hip: Normal-appearing. Normal external and internal rotation abduction and adduction.  Pain with flexion and extension.  Flexion range of motion limited by pain and guarding.  Strength is reduced to  hip abduction 4/5 and painful. Hip external rotation is also slightly weak 4/5 and painful.  Hip internal rotation is strong and nonpainful 5/5 Hip extension strength diminished 4/5 and painful.  Tender to palpation overlying piriformis.  Nonpainful trochanter iliac crest and ischial tuberosity.  Left hip normal-appearing nontender normal motion normal strength  Antalgic gait present.     Assessment and Plan: 63 y.o. female with  Right buttocks pain.  Very likely injury or spasm of piriformis.  She may have some compression of the sciatic nerve to the piriformis as well.  Plan for physical therapy and home exercise program.  Recheck in a few weeks if not improved.  If not improving neck step would be x-ray and potentially ultrasound or injection followed by MRI.  Return sooner if needed. Patient declined offered muscle relaxers which I do not think would be helpful much either.   CC. Rueben Bash Spangle, PA-C  [address] [place], [place] [address]  [phone]  [phone] (Fax)   Orders Placed This Encounter  Procedures  . Ambulatory referral to Physical Therapy    Referral Priority:   Routine    Referral Type:   Physical Medicine    Referral Reason:   Specialty Services Required    Requested Specialty:   Physical Therapy   No orders of the defined types were placed in this encounter.   Historical information moved to improve visibility of documentation.  Past Medical History:  Diagnosis Date  .  Anxiety   . Depression   . Genital HSV   . Insomnia    Past Surgical History:  Procedure Laterality Date  . ABDOMINAL HYSTERECTOMY  1981   still has 1 ovary, other removed due to scar tissue from hysterectomy  . AUGMENTATION MAMMAPLASTY    . BREAST BIOPSY Left 1997   Benign Core  . BREAST CYST ASPIRATION  09/18/2004   Benign cyst asp  . BREAST EXCISIONAL BIOPSY Left 1997   Benign exc bx  . BREAST SURGERY  2011   bilateral implants   Social  History   Tobacco Use  . Smoking status: Never Smoker  . Smokeless tobacco: Never Used  Substance Use Topics  . Alcohol use: No   family history includes Cancer in her father; Diabetes in her mother, sister, and sister; Heart disease in her mother and sister; Hyperlipidemia in her father and mother; Stroke in her father.  Medications: Current Outpatient Medications  Medication Sig Dispense Refill  . acyclovir (ZOVIRAX) 400 MG tablet TAKE 1 TABLET BY MOUTH EVERY DAY 90 tablet 3  . CALCIUM PO Take 1 tablet by mouth daily.    . Multiple Vitamin (MULTIVITAMIN WITH MINERALS) TABS tablet Take 1 tablet by mouth daily.    . sertraline (ZOLOFT) 100 MG tablet TAKE 1 AND 1/2 TABLETS DAILY 135 tablet 5  . SUMAtriptan (IMITREX) 100 MG tablet Take 1 tablet (100 mg total) by mouth once. May repeat in 2 hours if headache persists or recurs. 10 tablet 12  . zolpidem (AMBIEN) 10 MG tablet TAKE 1/2 TO 1 TABLET BY MOUTH AT BEDTIME 15 tablet 0   No current facility-administered medications for this visit.    No Known Allergies    Discussed warning signs or symptoms. Please see discharge instructions. Patient expresses understanding.

## 2018-02-10 NOTE — Patient Instructions (Addendum)
Thank you for coming in today. I think this is an injury to the piriformis.  Use heat and stretching.  Off load pressure if able using cushion.  Advance acitivity as tolerated.   Use heating pad and TENS unit.   Recheck if not improving.     TENS UNIT: This is helpful for muscle pain and spasm.   Search and Purchase a TENS 7000 2nd edition at  www.tenspros.com or www.Amazon.com It should be less than $30.     TENS unit instructions: Do not shower or bathe with the unit on Turn the unit off before removing electrodes or batteries If the electrodes lose stickiness add a drop of water to the electrodes after they are disconnected from the unit and place on plastic sheet. If you continued to have difficulty, call the TENS unit company to purchase more electrodes. Do not apply lotion on the skin area prior to use. Make sure the skin is clean and dry as this will help prolong the life of the electrodes. After use, always check skin for unusual red areas, rash or other skin difficulties. If there are any skin problems, does not apply electrodes to the same area. Never remove the electrodes from the unit by pulling the wires. Do not use the TENS unit or electrodes other than as directed. Do not change electrode placement without consultating your therapist or physician. Keep 2 fingers with between each electrode. Wear time ratio is 2:1, on to off times.    For example on for 30 minutes off for 15 minutes and then on for 30 minutes off for 15 minutes     Piriformis Syndrome  Piriformis syndrome is a condition that can cause pain and numbness in your buttocks and down the back of your leg. Piriformis syndrome happens when the small muscle that connects the base of your spine to your hip (piriformis muscle) presses on the nerve that runs down the back of your leg (sciatic nerve). The piriformis muscle helps your hip rotate and helps to bring your leg back and out. It also helps shift  your weight while you are walking to keep you stable. The sciatic nerve runs under or through the piriformis. Damage to the piriformis muscle can cause spasms that put pressure on the nerve below. This causes pain and discomfort while sitting and moving. The pain may feel as if it begins in the buttock and spreads (radiates) down your hip and thigh. What are the causes? This condition is caused by pressure on the sciatic nerve from the piriformis muscle. The piriformis muscle can get irritated with overuse, especially if other hip muscles are weak and the piriformis has to do extra work. Piriformis syndrome can also occur after an injury, like a fall onto your buttocks. What increases the risk? This condition is more likely to develop in:  Women.  People who sit for long periods of time.  Cyclists.  People who have weak buttocks muscles (gluteal muscles). What are the signs or symptoms? Pain, tingling, or numbness that starts in the buttock and runs down the back of your leg (sciatica) is the most common symptom of this condition. Your symptoms may:  Get worse the longer you sit.  Get worse when you walk, run, or go up on stairs. How is this diagnosed? This condition is diagnosed based on your symptoms, medical history, and physical exam. During this exam, your health care provider may move your leg into different positions to check for pain. He  or she will also press on the muscles of your hip and buttock to see if that increases your symptoms. You may also have an X-ray or MRI. How is this treated? Treatment for this condition may include:  Stopping all activities that cause pain or make your condition worse.  Using heat or ice to relieve pain as told by your health care provider.  Taking medicines to reduce pain and swelling.  Taking a muscle relaxer to release the piriformis muscle.  Doing range-of-motion and strengthening exercises (physical therapy) as told by your health care  provider.  Massaging the affected area.  Getting an injection of an anti-inflammatory medicine or muscle relaxer to reduce inflammation and muscle tension. In rare cases, you may need surgery to cut the muscle and release pressure on the nerve if other treatments do not work. Follow these instructions at home:  Take over-the-counter and prescription medicines only as told by your health care provider.  Do not sit for long periods. Get up and walk around every 20 minutes or as often as told by your health care provider.  If directed, apply heat to the affected area as often as told by your health care provider. Use the heat source that your health care provider recommends, such as a moist heat pack or a heating pad. ? Place a towel between your skin and the heat source. ? Leave the heat on for 20-30 minutes. ? Remove the heat if your skin turns bright red. This is especially important if you are unable to feel pain, heat, or cold. You may have a greater risk of getting burned.  If directed, apply ice to the injured area. ? Put ice in a plastic bag. ? Place a towel between your skin and the bag. ? Leave the ice on for 20 minutes, 2-3 times a day.  Do exercises as told by your health care provider.  Return to your normal activities as told by your health care provider. Ask your health care provider what activities are safe for you.  Keep all follow-up visits as told by your health care provider. This is important. How is this prevented?  Do not sit for longer than 20 minutes at a time. When you sit, choose padded surfaces.  Warm up and stretch before being active.  Cool down and stretch after being active.  Give your body time to rest between periods of activity.  Make sure to use equipment that fits you.  Maintain physical fitness, including: ? Strength. ? Flexibility. Contact a health care provider if:  Your pain and stiffness continue or get worse.  Your leg or hip  becomes weak.  You have changes in your bowel function or bladder function. This information is not intended to replace advice given to you by your health care provider. Make sure you discuss any questions you have with your health care provider. Document Released: 12/22/2004 Document Revised: 08/27/2015 Document Reviewed: 12/04/2014 Elsevier Interactive Patient Education  2019 Else[employer] Inc.    Piriformis Syndrome Rehab Ask your health care provider which exercises are safe for you. Do exercises exactly as told by your health care provider and adjust them as directed. It is normal to feel mild stretching, pulling, tightness, or discomfort as you do these exercises, but you should stop right away if you feel sudden pain or your pain gets worse.Do not begin these exercises until told by your health care provider. Stretching and range of motion exercises These exercises warm up your muscles and  joints and improve the movement and flexibility of your hip and pelvis. These exercises also help to relieve pain, numbness, and tingling. Exercise A: Hip rotators  1. Lie on your back on a firm surface. 2. Pull your left / right knee toward your same shoulder with your left / right hand until your knee is pointing toward the ceiling. Hold your left / right ankle with your other hand. 3. Keeping your knee steady, gently pull your left / right ankle toward your other shoulder until you feel a stretch in your buttocks. 4. Hold this position for __________ seconds. Repeat __________ times. Complete this stretch __________ times a day. Exercise B: Hip extensors 1. Lie on your back on a firm surface. Both of your legs should be straight. 2. Pull your left / right knee to your chest. Hold your leg in this position by holding onto the back of your thigh or the front of your knee. 3. Hold this position for __________ seconds. 4. Slowly return to the starting position. Repeat __________ times. Complete this  stretch __________ times a day. Strengthening exercises These exercises build strength and endurance in your hip and thigh muscles. Endurance is the ability to use your muscles for a long time, even after they get tired. Exercise C: Straight leg raises (hip abductors)  1. Lie on your side with your left / right leg in the top position. Lie so your head, shoulder, knee, and hip line up. Bend your bottom knee to help you balance. 2. Lift your top leg up 4-6 inches (10-15 cm), keeping your toes pointed straight ahead. 3. Hold this position for __________ seconds. 4. Slowly lower your leg to the starting position. Let your muscles relax completely. Repeat __________ times. Complete this exercise__________ times a day. Exercise D: Hip abductors and rotators, quadruped  1. Get on your hands and knees on a firm, lightly padded surface. Your hands should be directly below your shoulders, and your knees should be directly below your hips. 2. Lift your left / right knee out to the side. Keep your knee bent. Do not twist your body. 3. Hold this position for __________ seconds. 4. Slowly lower your leg. Repeat __________ times. Complete this exercise__________ times a day. Exercise E: Straight leg raises (hip extensors) 1. Lie on your abdomen on a bed or a firm surface with a pillow under your hips. 2. Squeeze your buttock muscles and lift your left / right thigh off the bed. Do not let your back arch. 3. Hold this position for __________ seconds. 4. Slowly return to the starting position. Let your muscles relax completely before doing another repetition. Repeat __________ times. Complete this exercise__________ times a day. This information is not intended to replace advice given to you by your health care provider. Make sure you discuss any questions you have with your health care provider. Document Released: 12/22/2004 Document Revised: 08/27/2015 Document Reviewed: 12/04/2014 Elsevier Interactive  Patient Education  2019 [employer].

## 2018-02-14 ENCOUNTER — Other Ambulatory Visit: Payer: Self-pay

## 2018-02-14 ENCOUNTER — Ambulatory Visit: Payer: BLUE CROSS/BLUE SHIELD | Attending: Family Medicine

## 2018-02-14 DIAGNOSIS — M25551 Pain in right hip: Secondary | ICD-10-CM | POA: Insufficient documentation

## 2018-02-14 DIAGNOSIS — R252 Cramp and spasm: Secondary | ICD-10-CM | POA: Diagnosis present

## 2018-02-14 NOTE — Patient Instructions (Signed)
Access Code: [email or web address]  URL: [email or web address]  Date: 02/14/2018  Prepared by: Lorrene Reid   Exercises Supine Piriformis Stretch with Leg Straight - 3 reps - 1 sets - 20 hold - 3x daily - 7x weekly Supine Figure 4 Piriformis Stretch - 3 reps - 1 sets - 20 hold - 3x daily - 7x weekly Seated Figure 4 Piriformis Stretch - 3 reps - 1 sets - 20 hold - 3x daily - 7x weekly Seated Hamstring Stretch - 3 reps - 1 sets - 20 hold - 3x daily - 7x weekly

## 2018-02-14 NOTE — Therapy (Addendum)
[facility] [address] [place], [place], [address] Phone: [phone]   Fax:  [phone]  Physical Therapy Evaluation  Patient Details  Name: Briana Turner MRN: [id number] Date of Birth: 1955/05/08 Referring Provider (PT): Lynne Leader, MD   Encounter Date: 02/14/2018  PT End of Session - 02/14/18 1612    Visit Number  1    Date for PT Re-Evaluation  04/11/18    Authorization Type  30 visit limit    Authorization - Visit Number  1    Authorization - Number of Visits  30    PT Start Time  7619    PT Stop Time  1610    PT Time Calculation (min)  37 min    Activity Tolerance  Patient tolerated treatment well    Behavior During Therapy  [facility] for tasks assessed/performed       Past Medical History:  Diagnosis Date  . Anxiety   . Depression   . Genital HSV   . Insomnia     Past Surgical History:  Procedure Laterality Date  . ABDOMINAL HYSTERECTOMY  1981   still has 1 ovary, other removed due to scar tissue from hysterectomy  . AUGMENTATION MAMMAPLASTY    . BREAST BIOPSY Left 1997   Benign Core  . BREAST CYST ASPIRATION  09/18/2004   Benign cyst asp  . BREAST EXCISIONAL BIOPSY Left 1997   Benign exc bx  . BREAST SURGERY  2011   bilateral implants    There were no vitals filed for this visit.   Subjective Assessment - 02/14/18 1535    Subjective  Pt presents to PT with complaints of Rt gluteal pain that began ~1 month ago without cause.  Pt reports that pain was at its worst last week when she saw the MD.  Pt got a TENs unit and has been using it.      Limitations  Sitting    How long can you sit comfortably?  sitting on donut pillow at work. 30 minutes     Diagnostic tests  none    Patient Stated Goals  reduce Rt buttock pain, sit without pillow, return to the gym without pain     Currently in Pain?  Yes    Pain Score  1    up to 5/10   Pain Location  Buttocks    Pain Orientation  Right    Pain  Descriptors / Indicators  Constant;Aching    Pain Type  Acute pain    Pain Radiating Towards  sometimes to the Rt posterior knee    Pain Onset  More than a month ago    Pain Frequency  Intermittent    Aggravating Factors   sitting, walking    Pain Relieving Factors  sitting on donut pillow, sitting with Rt=Lt         [facility] PT Assessment - 02/14/18 0001      Assessment   Medical Diagnosis  piriformis syndrome of the Rt side    Referring Provider (PT)  Lynne Leader, MD    Onset Date/Surgical Date  01/14/18    Next MD Visit  if needed    Prior Therapy  none      Precautions   Precautions  None      Restrictions   Weight Bearing Restrictions  No      Balance Screen   Has the patient fallen in the past 6 months  No    Has  the patient had a decrease in activity level because of a fear of falling?   No    Is the patient reluctant to leave their home because of a fear of falling?   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    Leisure  exercise, boxing, walking for exercise      Cognition   Overall Cognitive Status  Within Functional Limits for tasks assessed      Observation/Other Assessments   Focus on Therapeutic Outcomes (FOTO)   27% limitation      Posture/Postural Control   Posture/Postural Control  No significant limitations      ROM / Strength   AROM / PROM / Strength  AROM;PROM;Strength      AROM   Overall AROM   Within functional limits for tasks performed    Overall AROM Comments  lumbar and hip A/ROM      PROM   Overall PROM   Within functional limits for tasks performed    Overall PROM Comments  hips: tension at end range Rt hip flexion  and IR      Strength   Overall Strength  Within functional limits for tasks performed    Overall Strength Comments  5/5 hip strength except Rt hip extension and knee flexion 4+/5 with pain.      Palpation   Spinal mobility  reduced PA mobility L2-4 without pain    Palpation comment  localized palpable tenderness over Rt ischial tuberosity and proximal hamstrings at insertion.      Transfers   Transfers  Independent with all Transfers      Ambulation/Gait   Ambulation/Gait  Yes    Ambulation/Gait Assistance  7: Independent                Objective measurements completed on examination: See above findings.              PT Education - 02/14/18 1606    Education Details  Access Code: [email or web address]    Person(s) Educated  Patient    Methods  Explanation;Handout;Demonstration    Comprehension  Verbalized understanding;Returned demonstration       PT Short Term Goals - 02/14/18 1613      PT SHORT TERM GOAL #1   Title  be independent in initial HEP    Time  4    Period  Weeks    Status  New    Target Date  03/14/18      PT SHORT TERM GOAL #2   Title  report a 25% reduction in Rt gluteal pain after a boxing workout    Time  4    Period  Weeks    Status  New    Target Date  03/14/18      PT SHORT TERM GOAL #3   Title  sit for 1 hour at work without a cushion due to reduced Rt buttock pain    Time  4    Period  Weeks    Status  New    Target  Date  03/14/18        PT Long Term Goals - 02/14/18 1615      PT LONG TERM GOAL #1   Title  be independent in advanced HEP    Time  8    Period  Weeks    Status  New    Target Date  04/11/18      PT LONG TERM GOAL #2   Title  reduce FOTO to < or = to 22% limitation    Time  8    Period  Weeks    Status  New    Target Date  04/11/18      PT LONG TERM GOAL #3   Title  sit for work without limitation without cushion    Time  8    Period  Weeks    Status  New    Target Date  04/11/18      PT LONG TERM  GOAL #4   Title  report < or = to 2/10 Rt buttock pain after boxing class    Time  8    Period  Weeks    Status  New    Target Date  04/11/18             Plan - 02/14/18 1628    Clinical Impression Statement  Pt is an active 63 y.o. female who presents to PT with Rt gluteal and proximal hamstring pain that began ~1 month ago.  Pt is very active with boxing and exercise and reports that she has pain after her boxing class, with sitting >30 minutes (required to use a donut pillow) and with negotiating steps.  Pt demonstrates normal ROM and strength and has palpable tenderness over Rt ischial tuberosity and proximal hamstrings.  Pt will benefit from skilled PT to address pain with manual therapy, ionto patches and flexibility exercises.      History and Personal Factors relevant to plan of care:  none    Clinical Presentation  Stable    Clinical Presentation due to:  1 month history of pain    Clinical Decision Making  Low    Rehab Potential  Excellent    PT Frequency  2x / week    PT Duration  8 weeks    PT Treatment/Interventions  ADLs/Self Care Home Management;Cryotherapy;Electrical Stimulation;Iontophoresis 27m/ml Dexamethasone;Moist Heat;Ultrasound;Therapeutic exercise;Therapeutic activities;Neuromuscular re-education;Manual techniques;Passive range of motion;Dry needling;Taping    PT Next Visit Plan  ionto patch to Rt ischial tuberosity, dry needling to Rt proximal hamstrings and gluteals, review HEP    PT Home Exercise Plan  Access Code: [email or web address]   Consulted and Agree with Plan of Care  Patient       Patient will benefit from skilled therapeutic intervention in order to improve the following deficits and impairments:  Pain, Increased muscle spasms, Decreased activity tolerance  Visit Diagnosis: Pain in right hip - Plan: PT plan of care cert/re-cert  Cramp and spasm - Plan: PT plan of care cert/re-cert     Problem List Patient Active Problem List   Diagnosis Date Noted   . Acute pain of left knee 06/24/2017  . DDD (degenerative disc disease), cervical 10/23/2014  . 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 Insomnia 01/22/2012  . Depression 01/22/2012   KSigurd Sos PT 02/14/18 4:54 PM PHYSICAL THERAPY DISCHARGE SUMMARY  Visits from Start of Care: 1   Current functional level related to goals / functional outcomes: See above.  Pt called after evaluation and requested D/C from PT  as she was feeling better.  Pt was placed on hold and will now be discharged.     Remaining deficits: See above for most current PT status.     Education / Equipment: HEP Plan: Patient agrees to discharge.  Patient goals were not met. Patient is being discharged due to the patient's request.  ?????        Sigurd Sos, PT 03/29/18 4:03 PM   [facility] [address] [place], [place], [address] Phone: [phone]   Fax:  [phone]  Name: Briana Turner MRN: [id number] Date of Birth: Jan 04, 1956

## 2018-02-16 ENCOUNTER — Ambulatory Visit: Payer: BLUE CROSS/BLUE SHIELD | Admitting: Physical Therapy

## 2018-02-23 ENCOUNTER — Encounter: Payer: BLUE CROSS/BLUE SHIELD | Admitting: Physical Therapy

## 2018-03-07 ENCOUNTER — Ambulatory Visit
Admission: RE | Admit: 2018-03-07 | Discharge: 2018-03-07 | Disposition: A | Discharge status: Home / Self Care | Payer: BLUE CROSS/BLUE SHIELD | Source: Ambulatory Visit | Attending: Physician Assistant | Admitting: Physician Assistant

## 2018-03-07 DIAGNOSIS — Z1231 Encounter for screening mammogram for malignant neoplasm of breast: Secondary | ICD-10-CM

## 2019-03-16 ENCOUNTER — Ambulatory Visit: Payer: BC Managed Care – PPO | Attending: Internal Medicine

## 2019-03-16 DIAGNOSIS — Z23 Encounter for immunization: Secondary | ICD-10-CM

## 2019-03-16 NOTE — Progress Notes (Signed)
   Covid-19 Vaccination Clinic  Name:  RYAH CRIBB    MRN: [id number] DOB: [birth year]  03/16/2019  Ms. Pursley was observed post Covid-19 immunization for 15 minutes without incident. She was provided with Vaccine Information Sheet and instruction to access the V-Safe system.   Ms. Taft was instructed to call 911 with any severe reactions post vaccine: Marland Kitchen Difficulty breathing  . Swelling of face and throat  . A fast heartbeat  . A bad rash all over body  . Dizziness and weakness   Immunizations Administered    Name Date Dose VIS Date Route   Pfizer COVID-19 Vaccine 03/16/2019 12:10 PM 0.3 mL 12/16/2018 Intramuscular   Manufacturer: [employer], [employer]   Lot: [id number]   NDC: [address]-6

## 2019-04-10 ENCOUNTER — Ambulatory Visit: Payer: BC Managed Care – PPO | Attending: Internal Medicine

## 2019-04-10 DIAGNOSIS — Z23 Encounter for immunization: Secondary | ICD-10-CM

## 2019-04-10 NOTE — Progress Notes (Signed)
   Covid-19 Vaccination Clinic  Name:  Briana Turner    MRN: [id number] DOB: 1955-12-13  04/10/2019  Briana Turner was observed post Covid-19 immunization for 15 minutes without incident. She was provided with Vaccine Information Sheet and instruction to access the V-Safe system.   Briana Turner was instructed to call 911 with any severe reactions post vaccine: Marland Kitchen Difficulty breathing  . Swelling of face and throat  . A fast heartbeat  . A bad rash all over body  . Dizziness and weakness   Immunizations Administered    Name Date Dose VIS Date Route   Pfizer COVID-19 Vaccine 04/10/2019  9:54 AM 0.3 mL 12/16/2018 Intramuscular   Manufacturer: [employer], [employer]   Lot: [id number]   NDC: [address]-4

## 2019-05-31 ENCOUNTER — Ambulatory Visit
Admission: RE | Admit: 2019-05-31 | Discharge: 2019-05-31 | Disposition: A | Discharge status: Home / Self Care | Payer: BC Managed Care – PPO | Source: Ambulatory Visit | Attending: Physician Assistant | Admitting: Physician Assistant

## 2019-05-31 ENCOUNTER — Other Ambulatory Visit: Payer: Self-pay | Admitting: Physician Assistant

## 2019-05-31 ENCOUNTER — Other Ambulatory Visit: Payer: Self-pay

## 2019-05-31 DIAGNOSIS — M79671 Pain in right foot: Secondary | ICD-10-CM

## 2019-12-06 ENCOUNTER — Other Ambulatory Visit: Payer: Self-pay | Admitting: Physician Assistant

## 2019-12-06 DIAGNOSIS — Z1231 Encounter for screening mammogram for malignant neoplasm of breast: Secondary | ICD-10-CM

## 2020-01-19 ENCOUNTER — Other Ambulatory Visit: Payer: Self-pay

## 2020-01-19 ENCOUNTER — Ambulatory Visit
Admission: RE | Admit: 2020-01-19 | Discharge: 2020-01-19 | Disposition: A | Discharge status: Home / Self Care | Payer: BC Managed Care – PPO | Source: Ambulatory Visit | Attending: Physician Assistant | Admitting: Physician Assistant

## 2020-01-19 DIAGNOSIS — Z1231 Encounter for screening mammogram for malignant neoplasm of breast: Secondary | ICD-10-CM

## 2021-02-18 ENCOUNTER — Other Ambulatory Visit: Payer: Self-pay | Admitting: Physician Assistant

## 2021-02-18 DIAGNOSIS — Z1231 Encounter for screening mammogram for malignant neoplasm of breast: Secondary | ICD-10-CM

## 2021-03-04 ENCOUNTER — Other Ambulatory Visit: Payer: Self-pay

## 2021-03-04 ENCOUNTER — Ambulatory Visit
Admission: RE | Admit: 2021-03-04 | Discharge: 2021-03-04 | Disposition: A | Discharge status: Home / Self Care | Payer: BC Managed Care – PPO | Source: Ambulatory Visit | Attending: Physician Assistant | Admitting: Physician Assistant

## 2021-03-04 DIAGNOSIS — Z1231 Encounter for screening mammogram for malignant neoplasm of breast: Secondary | ICD-10-CM

## 2021-03-06 ENCOUNTER — Other Ambulatory Visit: Payer: Self-pay | Admitting: Physician Assistant

## 2021-03-06 DIAGNOSIS — R928 Other abnormal and inconclusive findings on diagnostic imaging of breast: Secondary | ICD-10-CM

## 2021-03-24 ENCOUNTER — Ambulatory Visit
Admission: RE | Admit: 2021-03-24 | Discharge: 2021-03-24 | Disposition: A | Discharge status: Home / Self Care | Payer: BC Managed Care – PPO | Source: Ambulatory Visit | Attending: Physician Assistant | Admitting: Physician Assistant

## 2021-03-24 ENCOUNTER — Other Ambulatory Visit: Payer: Self-pay

## 2021-03-24 DIAGNOSIS — R928 Other abnormal and inconclusive findings on diagnostic imaging of breast: Secondary | ICD-10-CM

## 2021-06-10 ENCOUNTER — Other Ambulatory Visit: Payer: Self-pay | Admitting: Physician Assistant

## 2021-06-10 DIAGNOSIS — E2839 Other primary ovarian failure: Secondary | ICD-10-CM

## 2021-11-17 ENCOUNTER — Ambulatory Visit
Admission: RE | Admit: 2021-11-17 | Discharge: 2021-11-17 | Disposition: A | Discharge status: Home / Self Care | Payer: BC Managed Care – PPO | Source: Ambulatory Visit | Attending: Physician Assistant | Admitting: Physician Assistant

## 2021-11-17 DIAGNOSIS — E2839 Other primary ovarian failure: Secondary | ICD-10-CM

## 2022-02-02 ENCOUNTER — Other Ambulatory Visit: Payer: Self-pay | Admitting: Physician Assistant

## 2022-02-02 DIAGNOSIS — Z1231 Encounter for screening mammogram for malignant neoplasm of breast: Secondary | ICD-10-CM

## 2022-03-23 ENCOUNTER — Ambulatory Visit
Admission: RE | Admit: 2022-03-23 | Discharge: 2022-03-23 | Disposition: A | Discharge status: Home / Self Care | Payer: BC Managed Care – PPO | Source: Ambulatory Visit | Attending: Physician Assistant | Admitting: Physician Assistant

## 2022-03-23 DIAGNOSIS — Z1231 Encounter for screening mammogram for malignant neoplasm of breast: Secondary | ICD-10-CM

## 2022-05-04 ENCOUNTER — Other Ambulatory Visit: Payer: Self-pay

## 2022-05-04 ENCOUNTER — Encounter (HOSPITAL_BASED_OUTPATIENT_CLINIC_OR_DEPARTMENT_OTHER): Payer: Self-pay | Admitting: Orthopedic Surgery

## 2022-05-08 NOTE — H&P (Signed)
Preoperative History & Physical Exam  Surgeon: Philipp Ovens, MD  Diagnosis: Bursitis of olecranon of left elbow  Planned Procedure: Procedure(s) (LRB): OLECRANON (ELBOW) BURSA EXCISION (Left) ulnar nerve decompression (Left)  History of Present Illness:   Patient is a 67 y.o. female with symptoms consistent with Bursitis of olecranon of left elbow who presents for surgical intervention. The risks, benefits and alternatives of surgical intervention were discussed and informed consent was obtained prior to surgery.  Past Medical History:  Past Medical History:  Diagnosis Date   Anxiety    Complication of anesthesia    hard time waking up   Depression    Genital HSV    Insomnia     Past Surgical History:  Past Surgical History:  Procedure Laterality Date   ABDOMINAL HYSTERECTOMY  1981   still has 1 ovary, other removed due to scar tissue from hysterectomy   AUGMENTATION MAMMAPLASTY     BREAST BIOPSY Left 1997   Benign Core   BREAST CYST ASPIRATION  09/18/2004   Benign cyst asp   BREAST EXCISIONAL BIOPSY Left 1997   Benign exc bx   BREAST SURGERY  2011   bilateral implants    Medications:  Prior to Admission medications   Medication Sig Start Date End Date Taking? Authorizing Provider  acyclovir (ZOVIRAX) 400 MG tablet TAKE 1 TABLET BY MOUTH EVERY DAY 10/23/14  Yes Dorna Leitz, PA-C  CALCIUM PO Take 1 tablet by mouth daily.   Yes [provider]  meloxicam (MOBIC) 7.5 MG tablet Take 7.5 mg by mouth daily.   Yes [provider]  Multiple Vitamin (MULTIVITAMIN WITH MINERALS) TABS tablet Take 1 tablet by mouth daily.   Yes [provider]  Omega-3 Fatty Acids (FISH OIL) 1000 MG CAPS Take by mouth.   Yes [provider]  sertraline (ZOLOFT) 100 MG tablet TAKE 1 AND 1/2 TABLETS DAILY 10/23/14  Yes Dorna Leitz, PA-C  zolpidem (AMBIEN) 10 MG tablet TAKE 1/2 TO 1 TABLET BY MOUTH AT BEDTIME 07/29/15  Yes Dorna Leitz, PA-C   SUMAtriptan (IMITREX) 100 MG tablet Take 1 tablet (100 mg total) by mouth once. May repeat in 2 hours if headache persists or recurs. 01/25/14   Carmelina Dane, MD    Allergies:  Amoxicillin  Review of Systems: Negative except per HPI.  Physical Exam: Alert and oriented, NAD Head and neck: no masses, normal alignment CV: pulse intact Pulm: no increased work of breathing, respirations even and unlabored Abdomen: non-distended Extremities: extremities warm and well perfused  LABS: No results found for this or any previous visit (from the past 2160 hour(s)).   Complete History and Physical exam available in the office notes  Briana Turner Briana Turner

## 2022-05-12 ENCOUNTER — Ambulatory Visit (HOSPITAL_BASED_OUTPATIENT_CLINIC_OR_DEPARTMENT_OTHER): Payer: BC Managed Care – PPO | Admitting: Anesthesiology

## 2022-05-12 ENCOUNTER — Ambulatory Visit (HOSPITAL_BASED_OUTPATIENT_CLINIC_OR_DEPARTMENT_OTHER)
Admission: RE | Admit: 2022-05-12 | Discharge: 2022-05-12 | Disposition: A | Discharge status: Home / Self Care | Payer: BC Managed Care – PPO | Source: Ambulatory Visit | Attending: Orthopedic Surgery | Admitting: Orthopedic Surgery

## 2022-05-12 ENCOUNTER — Encounter (HOSPITAL_BASED_OUTPATIENT_CLINIC_OR_DEPARTMENT_OTHER)
Admission: RE | Disposition: A | Discharge status: Home / Self Care | Payer: Self-pay | Source: Ambulatory Visit | Attending: Orthopedic Surgery

## 2022-05-12 ENCOUNTER — Other Ambulatory Visit: Payer: Self-pay | Admitting: Physician Assistant

## 2022-05-12 ENCOUNTER — Encounter (HOSPITAL_BASED_OUTPATIENT_CLINIC_OR_DEPARTMENT_OTHER): Payer: Self-pay | Admitting: Orthopedic Surgery

## 2022-05-12 ENCOUNTER — Other Ambulatory Visit: Payer: Self-pay

## 2022-05-12 DIAGNOSIS — M7022 Olecranon bursitis, left elbow: Secondary | ICD-10-CM | POA: Diagnosis present

## 2022-05-12 DIAGNOSIS — Z01818 Encounter for other preprocedural examination: Secondary | ICD-10-CM

## 2022-05-12 DIAGNOSIS — Y939 Activity, unspecified: Secondary | ICD-10-CM | POA: Insufficient documentation

## 2022-05-12 DIAGNOSIS — F419 Anxiety disorder, unspecified: Secondary | ICD-10-CM | POA: Insufficient documentation

## 2022-05-12 DIAGNOSIS — M778 Other enthesopathies, not elsewhere classified: Secondary | ICD-10-CM | POA: Diagnosis not present

## 2022-05-12 HISTORY — DX: Migraine, unspecified, not intractable, without status migrainosus: G43.909

## 2022-05-12 HISTORY — PX: ANTERIOR INTEROSSEOUS NERVE DECOMPRESSION: SHX5735

## 2022-05-12 HISTORY — DX: Other complications of anesthesia, initial encounter: T88.59XA

## 2022-05-12 HISTORY — PX: OLECRANON BURSECTOMY: SHX2097

## 2022-05-12 HISTORY — DX: Other specified postprocedural states: R11.2

## 2022-05-12 HISTORY — DX: Other specified postprocedural states: Z98.890

## 2022-05-12 SURGERY — BURSECTOMY, ELBOW
Anesthesia: Monitor Anesthesia Care | Site: Elbow | Laterality: Left

## 2022-05-12 MED ORDER — LACTATED RINGERS IV SOLN: INTRAVENOUS | Status: DC

## 2022-05-12 MED ORDER — ONDANSETRON HCL 4 MG/2ML IJ SOLN: 4.0000 mg | Freq: Once | INTRAMUSCULAR | Status: DC | PRN

## 2022-05-12 MED ORDER — BACITRACIN ZINC 500 UNIT/GM EX OINT
TOPICAL_OINTMENT | CUTANEOUS | Status: AC
  Filled 2022-05-12: qty 28.35

## 2022-05-12 MED ORDER — KETOROLAC TROMETHAMINE 30 MG/ML IJ SOLN: 30.0000 mg | Freq: Once | INTRAMUSCULAR | Status: DC | PRN

## 2022-05-12 MED ORDER — LIDOCAINE 2% (20 MG/ML) 5 ML SYRINGE
INTRAMUSCULAR | Status: AC
  Filled 2022-05-12: qty 5

## 2022-05-12 MED ORDER — ONDANSETRON HCL 4 MG/2ML IJ SOLN
INTRAMUSCULAR | Status: DC | PRN
  Administered 2022-05-12: 4 mg via INTRAVENOUS

## 2022-05-12 MED ORDER — LIDOCAINE HCL 1 % IJ SOLN
INTRAMUSCULAR | Status: DC | PRN
  Administered 2022-05-12: 5 mL

## 2022-05-12 MED ORDER — TRAMADOL HCL 50 MG PO TABS: 50.0000 mg | ORAL_TABLET | Freq: Three times a day (TID) | ORAL | 0 refills | Status: AC | PRN

## 2022-05-12 MED ORDER — MIDAZOLAM HCL 5 MG/5ML IJ SOLN
INTRAMUSCULAR | Status: DC | PRN
  Administered 2022-05-12: 2 mg via INTRAVENOUS

## 2022-05-12 MED ORDER — FENTANYL CITRATE (PF) 100 MCG/2ML IJ SOLN
INTRAMUSCULAR | Status: AC
  Filled 2022-05-12: qty 2

## 2022-05-12 MED ORDER — OXYCODONE HCL 5 MG/5ML PO SOLN: 5.0000 mg | Freq: Once | ORAL | Status: DC | PRN

## 2022-05-12 MED ORDER — BACITRACIN ZINC 500 UNIT/GM EX OINT
TOPICAL_OINTMENT | CUTANEOUS | Status: DC | PRN
  Administered 2022-05-12: 1 via TOPICAL

## 2022-05-12 MED ORDER — TRAMADOL HCL 50 MG PO TABS: 50.0000 mg | ORAL_TABLET | Freq: Four times a day (QID) | ORAL | 0 refills | Status: DC | PRN

## 2022-05-12 MED ORDER — BUPIVACAINE HCL (PF) 0.5 % IJ SOLN
INTRAMUSCULAR | Status: AC
  Filled 2022-05-12: qty 30

## 2022-05-12 MED ORDER — CEFAZOLIN SODIUM-DEXTROSE 2-4 GM/100ML-% IV SOLN
2.0000 g | INTRAVENOUS | Status: AC
  Administered 2022-05-12: 2 g via INTRAVENOUS

## 2022-05-12 MED ORDER — FENTANYL CITRATE (PF) 100 MCG/2ML IJ SOLN: 25.0000 ug | INTRAMUSCULAR | Status: DC | PRN

## 2022-05-12 MED ORDER — LIDOCAINE HCL (CARDIAC) PF 100 MG/5ML IV SOSY
PREFILLED_SYRINGE | INTRAVENOUS | Status: DC | PRN
  Administered 2022-05-12: 40 mg via INTRAVENOUS

## 2022-05-12 MED ORDER — PROPOFOL 500 MG/50ML IV EMUL
INTRAVENOUS | Status: DC | PRN
  Administered 2022-05-12: 100 ug/kg/min via INTRAVENOUS

## 2022-05-12 MED ORDER — PROPOFOL 500 MG/50ML IV EMUL
INTRAVENOUS | Status: AC
  Filled 2022-05-12: qty 50

## 2022-05-12 MED ORDER — FENTANYL CITRATE (PF) 100 MCG/2ML IJ SOLN
INTRAMUSCULAR | Status: DC | PRN
  Administered 2022-05-12: 50 ug via INTRAVENOUS

## 2022-05-12 MED ORDER — MIDAZOLAM HCL 2 MG/2ML IJ SOLN
INTRAMUSCULAR | Status: AC
  Filled 2022-05-12: qty 2

## 2022-05-12 MED ORDER — OXYCODONE HCL 5 MG PO TABS: 5.0000 mg | ORAL_TABLET | Freq: Once | ORAL | Status: DC | PRN

## 2022-05-12 MED ORDER — LIDOCAINE HCL (PF) 1 % IJ SOLN
INTRAMUSCULAR | Status: AC
  Filled 2022-05-12: qty 30

## 2022-05-12 MED ORDER — HYDROCODONE-ACETAMINOPHEN 5-325 MG PO TABS: 1.0000 | ORAL_TABLET | Freq: Four times a day (QID) | ORAL | 0 refills | Status: DC | PRN

## 2022-05-12 MED ORDER — PROPOFOL 10 MG/ML IV BOLUS
INTRAVENOUS | Status: DC | PRN
  Administered 2022-05-12: 50 mg via INTRAVENOUS

## 2022-05-12 MED ORDER — CEFAZOLIN SODIUM-DEXTROSE 2-4 GM/100ML-% IV SOLN
INTRAVENOUS | Status: AC
  Filled 2022-05-12: qty 100

## 2022-05-12 MED ORDER — ONDANSETRON HCL 4 MG/2ML IJ SOLN
INTRAMUSCULAR | Status: AC
  Filled 2022-05-12: qty 2

## 2022-05-12 MED ORDER — BUPIVACAINE HCL (PF) 0.5 % IJ SOLN
INTRAMUSCULAR | Status: DC | PRN
  Administered 2022-05-12: 5 mL

## 2022-05-12 MED ORDER — 0.9 % SODIUM CHLORIDE (POUR BTL) OPTIME
TOPICAL | Status: DC | PRN
  Administered 2022-05-12: 200 mL

## 2022-05-12 SURGICAL SUPPLY — 63 items
BLADE SURG 15 STRL LF DISP TIS (BLADE) ×3 IMPLANT
BLADE SURG 15 STRL SS (BLADE) ×2
BNDG CMPR 5X3 KNIT ELC UNQ LF (GAUZE/BANDAGES/DRESSINGS) ×4
BNDG CMPR 5X4 KNIT ELC UNQ LF (GAUZE/BANDAGES/DRESSINGS)
BNDG CMPR 9X4 STRL LF SNTH (GAUZE/BANDAGES/DRESSINGS) ×2
BNDG ELASTIC 3INX 5YD STR LF (GAUZE/BANDAGES/DRESSINGS) ×6 IMPLANT
BNDG ELASTIC 4INX 5YD STR LF (GAUZE/BANDAGES/DRESSINGS) IMPLANT
BNDG ESMARK 4X9 LF (GAUZE/BANDAGES/DRESSINGS) ×3 IMPLANT
BRUSH SCRUB EZ PLAIN DRY (MISCELLANEOUS) ×3 IMPLANT
CANISTER SUCT 1200ML W/VALVE (MISCELLANEOUS) ×3 IMPLANT
CORD BIPOLAR FORCEPS 12FT (ELECTRODE) ×3 IMPLANT
COVER BACK TABLE 60X90IN (DRAPES) ×3 IMPLANT
CUFF TOURN SGL QUICK 18X4 (TOURNIQUET CUFF) ×3 IMPLANT
DRAPE EXTREMITY T 121X128X90 (DISPOSABLE) ×3 IMPLANT
DRAPE SURG 17X23 STRL (DRAPES) ×3 IMPLANT
DRSG EMULSION OIL 3X3 NADH (GAUZE/BANDAGES/DRESSINGS) ×3 IMPLANT
GAUZE SPONGE 4X4 12PLY STRL (GAUZE/BANDAGES/DRESSINGS) ×3 IMPLANT
GLOVE BIO SURGEON STRL SZ 6 (GLOVE) ×3 IMPLANT
GLOVE BIOGEL PI IND STRL 6.5 (GLOVE) ×3 IMPLANT
GLOVE BIOGEL PI IND STRL 7.5 (GLOVE) ×3 IMPLANT
GOWN STRL REUS W/ TWL LRG LVL3 (GOWN DISPOSABLE) ×3 IMPLANT
GOWN STRL REUS W/TWL LRG LVL3 (GOWN DISPOSABLE) ×2
GOWN STRL REUS W/TWL XL LVL3 (GOWN DISPOSABLE) ×3 IMPLANT
LOOP VASCLR MAXI BLUE 18IN ST (MISCELLANEOUS) ×3 IMPLANT
LOOP VASCULAR MAXI 18 BLUE (MISCELLANEOUS) ×2
LOOPS VASCLR MAXI BLUE 18IN ST (MISCELLANEOUS) ×2 IMPLANT
NDL HYPO 22X1.5 SAFETY MO (MISCELLANEOUS) IMPLANT
NDL HYPO 25X1 1.5 SAFETY (NEEDLE) ×3 IMPLANT
NEEDLE HYPO 22X1.5 SAFETY MO (MISCELLANEOUS) IMPLANT
NEEDLE HYPO 25X1 1.5 SAFETY (NEEDLE) ×2 IMPLANT
NS IRRIG 1000ML POUR BTL (IV SOLUTION) ×3 IMPLANT
PACK BASIN DAY SURGERY FS (CUSTOM PROCEDURE TRAY) ×3 IMPLANT
PAD CAST 3X4 CTTN HI CHSV (CAST SUPPLIES) ×6 IMPLANT
PAD CAST 4YDX4 CTTN HI CHSV (CAST SUPPLIES) ×3 IMPLANT
PADDING CAST ABS COTTON 4X4 ST (CAST SUPPLIES) ×3 IMPLANT
PADDING CAST COTTON 3X4 STRL (CAST SUPPLIES) ×4
PADDING CAST COTTON 4X4 STRL (CAST SUPPLIES) ×2
PADDING CAST SYNTHETIC 4X4 STR (CAST SUPPLIES) IMPLANT
SHEET MEDIUM DRAPE 40X70 STRL (DRAPES) ×3 IMPLANT
SPIKE FLUID TRANSFER (MISCELLANEOUS) IMPLANT
SPLINT FIBERGLASS 3X35 (CAST SUPPLIES) ×3 IMPLANT
SPLINT FIBERGLASS 4X30 (CAST SUPPLIES) IMPLANT
SUCTION FRAZIER HANDLE 10FR (MISCELLANEOUS)
SUCTION TUBE FRAZIER 10FR DISP (MISCELLANEOUS) IMPLANT
SUT BONE WAX W31G (SUTURE) IMPLANT
SUT CHROMIC 4 0 PS 2 18 (SUTURE) IMPLANT
SUT ETHIBOND 2-0 V-5 NDL (SUTURE) IMPLANT
SUT ETHIBOND 2-0 V-5 NEEDLE (SUTURE) IMPLANT
SUT ETHILON 4 0 PS 2 18 (SUTURE) ×3 IMPLANT
SUT FIBERWIRE 2-0 18 17.9 3/8 (SUTURE)
SUT PROLENE 4 0 PS 2 18 (SUTURE) IMPLANT
SUT VIC AB 3-0 FS2 27 (SUTURE) IMPLANT
SUT VIC AB 4-0 P-3 18XBRD (SUTURE) IMPLANT
SUT VIC AB 4-0 P3 18 (SUTURE)
SUT VIC AB 4-0 PS2 27 (SUTURE) ×3 IMPLANT
SUTURE FIBERWR 2-0 18 17.9 3/8 (SUTURE) IMPLANT
SYR 10ML LL (SYRINGE) ×3 IMPLANT
SYR BULB EAR ULCER 3OZ GRN STR (SYRINGE) ×3 IMPLANT
TAPE SURG TRANSPORE 1 IN (GAUZE/BANDAGES/DRESSINGS) ×3 IMPLANT
TOWEL GREEN STERILE FF (TOWEL DISPOSABLE) ×6 IMPLANT
TUBE CONNECTING 20X1/4 (TUBING) IMPLANT
UNDERPAD 30X36 HEAVY ABSORB (UNDERPADS AND DIAPERS) ×3 IMPLANT
VASCULAR TIE MAXI BLUE 18IN ST (MISCELLANEOUS) ×2

## 2022-05-12 NOTE — Anesthesia Postprocedure Evaluation (Signed)
Anesthesia Post Note  Patient: Briana Turner  Procedure(s) Performed: OLECRANON (ELBOW) BURSA EXCISION (Left: Elbow) ulnar nerve decompression (Left)     Patient location during evaluation: PACU Anesthesia Type: MAC Level of consciousness: awake and alert Pain management: pain level controlled Vital Signs Assessment: post-procedure vital signs reviewed and stable Respiratory status: spontaneous breathing, nonlabored ventilation, respiratory function stable and patient connected to nasal cannula oxygen Cardiovascular status: stable and blood pressure returned to baseline Postop Assessment: no apparent nausea or vomiting Anesthetic complications: no  No notable events documented.  Last Vitals:  Vitals:   05/12/22 0812 05/12/22 0815  BP: 104/70 97/69  Pulse: (!) 57 61  Resp: (!) 22 18  Temp: (!) 36.3 C   SpO2: 98% 99%    Last Pain:  Vitals:   05/12/22 0815  TempSrc:   PainSc: 0-No pain                 Egidio Lofgren S

## 2022-05-12 NOTE — Transfer of Care (Signed)
Immediate Anesthesia Transfer of Care Note  Patient: Briana Turner  Procedure(s) Performed: OLECRANON (ELBOW) BURSA EXCISION (Left: Elbow) ulnar nerve decompression (Left)  Patient Location: PACU  Anesthesia Type:MAC  Level of Consciousness: sedated  Airway & Oxygen Therapy: Patient Spontanous Breathing and Patient connected to face mask  Post-op Assessment: Report given to RN and Post -op Vital signs reviewed and stable  Post vital signs: Reviewed and stable  Last Vitals:  Vitals Value Taken Time  BP    Temp    Pulse 57 05/12/22 0811  Resp 22 05/12/22 0811  SpO2 98 % 05/12/22 0811  Vitals shown include unvalidated device data.  Last Pain:  Vitals:   05/12/22 0625  TempSrc: Oral  PainSc: 0-No pain      Patients Stated Pain Goal: 5 (05/12/22 1610)  Complications: No notable events documented.

## 2022-05-12 NOTE — Discharge Instructions (Addendum)
Orthopaedic Hand Surgery Discharge Instructions  WEIGHT BEARING STATUS: Non weight bearing on operative extremity  DRESSING CARE: Please keep your dressing/splint/cast clean and dry until your follow-up appointment. You may shower by placing a waterproof covering over your dressing/splint/cast. Contact your surgeon if your splint/cast gets wet. It will need to be changed to prevent skin breakdown.  PAIN CONTROL: First line medications for post operative pain control are Tylenol (acetaminophen) and Motrin (ibuprofen) if you are able to take these medications. If you have been prescribed a medication these can be taken as breakthrough pain medications. Please note that some narcotic pain medication has acetaminophen added and you should never consume more than 4,000mg of acetaminophen in 24-hour period. Please note that if you are given Toradol (ketorolac) you should not take similar medications such as ibuprofen or naproxen.  DISCHARGE MEDICATIONS: If you have been prescribed medication it was sent electronically to your pharmacy. No changes have been made to your home medications.  ICE/ELEVATION: Ice and elevate your injured extremity as needed. Avoid direct contact of ice with skin.   BANDAGE FEELS TOO TIGHT: If your bandage feels too tight, first make sure you are elevating your fingers as much as possible. The outer layer of the bandage can be unwrapped and reapplied more loosely. If no improvement, you may carefully cut the inner layer longitudinally until the pressure has resolved and then rewrap the outer layer. If you are not comfortable with these instructions, please call the office and the bandage can be changed for you.   FOLLOW UP: You will be called after surgery with an appointment date and time, however if you have not received a phone call within 3 days, please call during regular office hours at [phone] to schedule a post operative appointment.  Please Seek Medical Attention  if: Call MD for: pain or pressure in chest, jaw, arm, back, neck  Call MD for: temperature greater than 101 F for more than 24 hrs Call MD for: difficulty breathing Call MD for: incision redness, bleeding, drainage  Call MD for: palpitations or feeling that the heart is racing  Call MD for: increased swelling in arm, leg, ankle, or abdomen  Call MD for: lightheadedness, dizziness, fainting Call 911 or go to ER for any medical emergency if you are not able to get in touch with your doctor   J. Reid Spears, MD Orthopaedic Hand Surgeon [facility] Office number: [phone] [address] [place], [place] [address]  Post Anesthesia Home Care Instructions  Activity: Get plenty of rest for the remainder of the day. A responsible individual must stay with you for 24 hours following the procedure.  For the next 24 hours, DO NOT: -Drive a car -Operate machinery -Drink alcoholic beverages -Take any medication unless instructed by your physician -Make any legal decisions or sign important papers.  Meals: Start with liquid foods such as gelatin or soup. Progress to regular foods as tolerated. Avoid greasy, spicy, heavy foods. If nausea and/or vomiting occur, drink only clear liquids until the nausea and/or vomiting subsides. Call your physician if vomiting continues.  Special Instructions/Symptoms: Your throat may feel dry or sore from the anesthesia or the breathing tube placed in your throat during surgery. If this causes discomfort, gargle with warm salt water. The discomfort should disappear within 24 hours.  If you had a scopolamine patch placed behind your ear for the management of post- operative nausea and/or vomiting:  1. The medication in the patch is effective for 72 hours,   after which it should be removed.  Wrap patch in a tissue and discard in the trash. Wash hands thoroughly with soap and water. 2. You may remove the patch earlier than 72 hours if you experience  unpleasant side effects which may include dry mouth, dizziness or visual disturbances. 3. Avoid touching the patch. Wash your hands with soap and water after contact with the patch.     

## 2022-05-12 NOTE — Op Note (Signed)
OPERATIVE NOTE  DATE OF PROCEDURE: 05/12/2022  SURGEONS:  Primary: Gomez Cleverly, MD  ASSISTANT: Payton Mccallum, PA-C  Due to the complexity of the surgery an assistant was necessary to aid in retraction, exposure, limb positioning, closure and dressing application. The use of an assistant on this case follows CMS and CPT guidelines, which allows an assistant to be used because of the complexity level of this case.   PREOPERATIVE DIAGNOSIS: Left elbow olecranon bursitis, Left elbow olecranon bone spur  POSTOPERATIVE DIAGNOSIS: Same  NAME OF PROCEDURE:   Left elbow olecranon bursa excision Left elbow olecranon bone spur ostectomy  ANESTHESIA: Monitor Anesthesia Care + Local  SKIN PREPARATION: Hibiclens  ESTIMATED BLOOD LOSS: Minimal  IMPLANTS: none  INDICATIONS:  Briana Turner is a 67 y.o. female who has the above preoperative diagnosis. The patient has decided to proceed with surgical intervention.  Risks, benefits and alternatives of operative management were discussed including, but not limited to, risks of anesthesia complications, infection, pain, persistent symptoms, stiffness, need for future surgery.  The patient understands, agrees and elects to proceed with surgery.    DESCRIPTION OF PROCEDURE: The patient was met in the pre-operative area and their identity was verified.  The operative location and laterality was also verified and marked.  The patient was brought to the OR and was placed supine on the table.  After repeat patient identification with the operative team anesthesia was provided and the patient was prepped and draped in the usual sterile fashion.  A final timeout was performed verifying the correction patient, procedure, location and laterality.  Preoperative antibiotics were provided and the left upper extremity was elevated and exsanguinated with an Esmarch and tourniquet inflated to 250 mmHg.  A curvilinear incision was made over the posterior lateral aspect of the left  olecranon.  Skin and subcutaneous tissues were divided and careful hemostasis was obtained.  The olecranon bursa was thick and inflamed with chronic scarring.  This was identified and excised completely.  There was significant amount of bursal fluid and thickening of the bursa.  This was excised in its entirety.  Bipolar electrocautery was utilized to obtain careful hemostasis throughout the bursa. The olecranon bone spur was identified and using a ronguer the excess prominent bone was removed. The bone end was smoothed with a rasp to a smooth surface. The skin was approximated and was gliding smoothly over the olecranon. The wound was thoroughly irrigated with normal saline.  The skin was closed in layers with 4-0 interrupted buried Vicryl sutures followed by 4-0 horizontal mattress chromic sutures.  A sterile soft bulky bandage was applied with the elbow in full extension.  The tourniquet was deflated and the fingers were pink and warm and well-perfused at the end of the case.  The patient tolerated the procedure well.  All counts were correct x2.  The patient was awoken from anesthesia and brought to PACU for recovery in stable condition.   Philipp Ovens, MD

## 2022-05-12 NOTE — Anesthesia Preprocedure Evaluation (Signed)
Anesthesia Evaluation  Patient identified by MRN, date of birth, ID band Patient awake    Reviewed: Allergy & Precautions, H&P , NPO status , Patient's Chart, lab work & pertinent test results  History of Anesthesia Complications (+) PONV and history of anesthetic complications  Airway Mallampati: II  TM Distance: >3 FB Neck ROM: Full    Dental no notable dental hx.    Pulmonary neg pulmonary ROS   Pulmonary exam normal breath sounds clear to auscultation       Cardiovascular negative cardio ROS Normal cardiovascular exam Rhythm:Regular Rate:Normal     Neuro/Psych   Anxiety     negative neurological ROS     GI/Hepatic negative GI ROS, Neg liver ROS,,,  Endo/Other  negative endocrine ROS    Renal/GU negative Renal ROS  negative genitourinary   Musculoskeletal negative musculoskeletal ROS (+)    Abdominal   Peds negative pediatric ROS (+)  Hematology negative hematology ROS (+)   Anesthesia Other Findings   Reproductive/Obstetrics negative OB ROS                             Anesthesia Physical Anesthesia Plan  ASA: 1  Anesthesia Plan: MAC   Post-op Pain Management: Minimal or no pain anticipated   Induction: Intravenous  PONV Risk Score and Plan: 3 and Propofol infusion, Ondansetron and Treatment may vary due to age or medical condition  Airway Management Planned: Simple Face Mask  Additional Equipment:   Intra-op Plan:   Post-operative Plan:   Informed Consent: I have reviewed the patients History and Physical, chart, labs and discussed the procedure including the risks, benefits and alternatives for the proposed anesthesia with the patient or authorized representative who has indicated his/her understanding and acceptance.     Dental advisory given  Plan Discussed with: CRNA and Surgeon  Anesthesia Plan Comments:        Anesthesia Quick Evaluation

## 2022-05-12 NOTE — Interval H&P Note (Signed)
History and Physical Interval Note:  05/12/2022 7:36 AM  Briana Turner  has presented today for surgery, with the diagnosis of Bursitis of olecranon of left elbow.  The various methods of treatment have been discussed with the patient and family. After consideration of risks, benefits and other options for treatment, the patient has consented to  Procedure(s) with comments: OLECRANON (ELBOW) BURSA EXCISION (Left) - MAC and local ulnar nerve decompression (Left) - MAC and Local 60 min as a surgical intervention.  The patient's history has been reviewed, patient examined, no change in status, stable for surgery.  I have reviewed the patient's chart and labs.  Questions were answered to the patient's satisfaction.     Gomez Cleverly

## 2022-05-13 ENCOUNTER — Encounter (HOSPITAL_BASED_OUTPATIENT_CLINIC_OR_DEPARTMENT_OTHER): Payer: Self-pay | Admitting: Orthopedic Surgery

## 2022-09-03 IMAGING — MG DIGITAL SCREENING BREAST BILAT IMPLANT W/ TOMO W/ CAD
9 of 14 series · 9 of 34 positions shown · non-contrast
Comparison: Previous exam(s).

CLINICAL DATA: Screening.

EXAM:
DIGITAL SCREENING BILATERAL MAMMOGRAM WITH IMPLANTS, CAD AND
TOMOSYNTHESIS
TECHNIQUE: Bilateral screening digital craniocaudal and mediolateral oblique
mammograms were obtained. Bilateral screening digital breast
tomosynthesis was performed. The images were evaluated with
computer-aided detection. Standard and/or implant displaced views
were performed.

[R MLO]
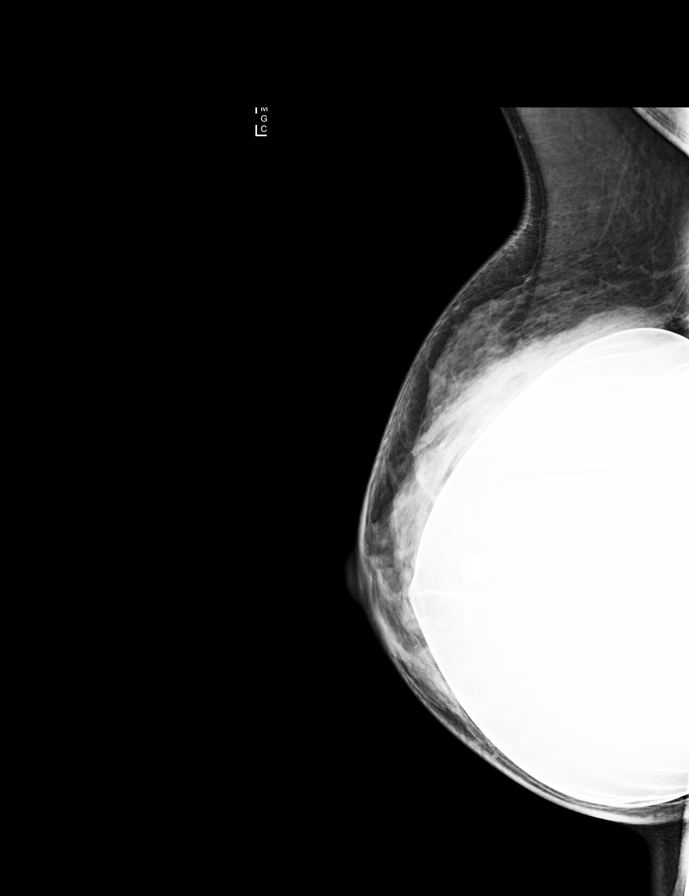

[L MLO]
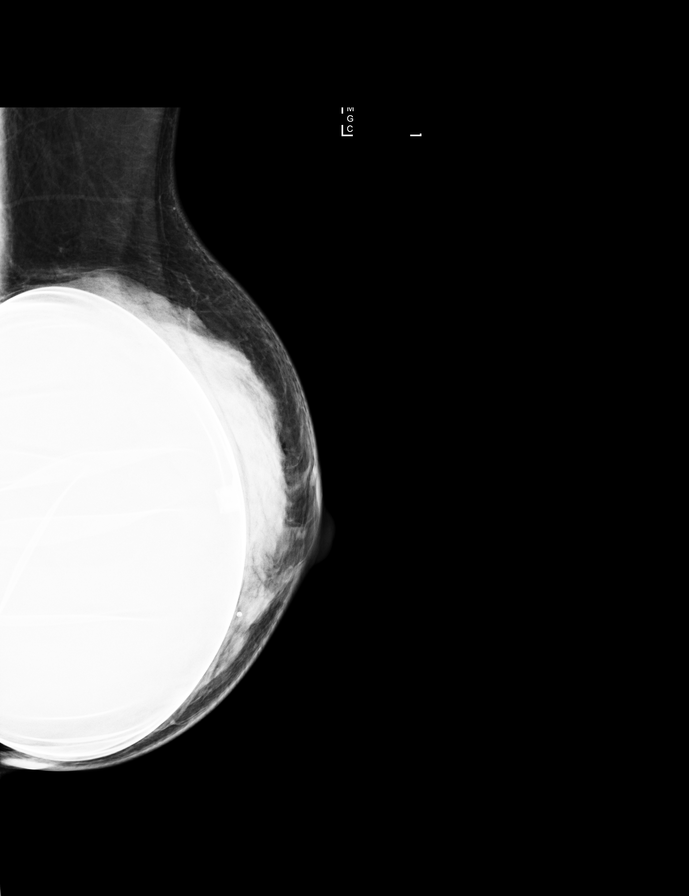

[L CC]
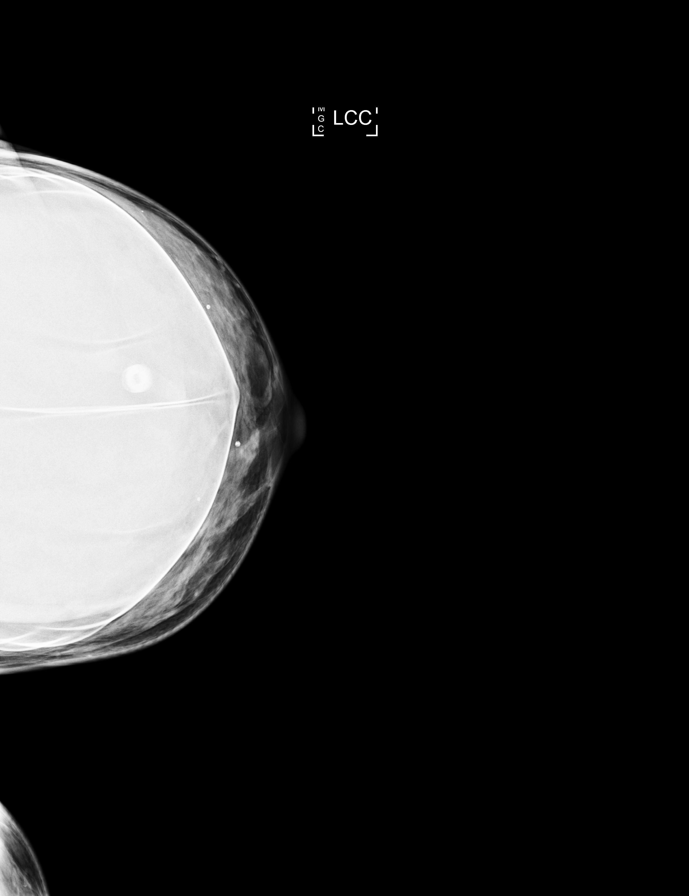

[R CC]
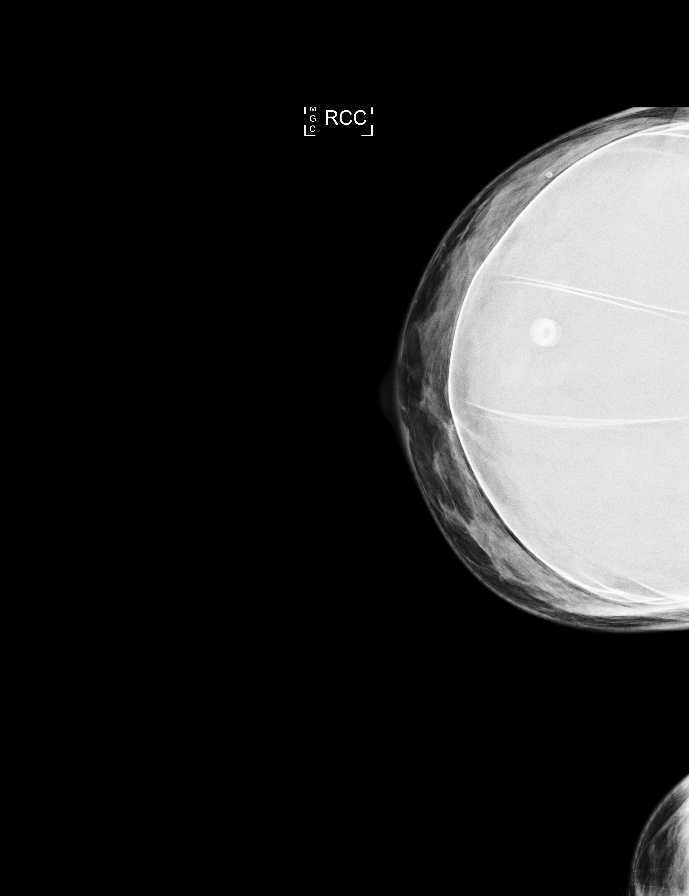

[R CC synth-2D (1 of 2)]
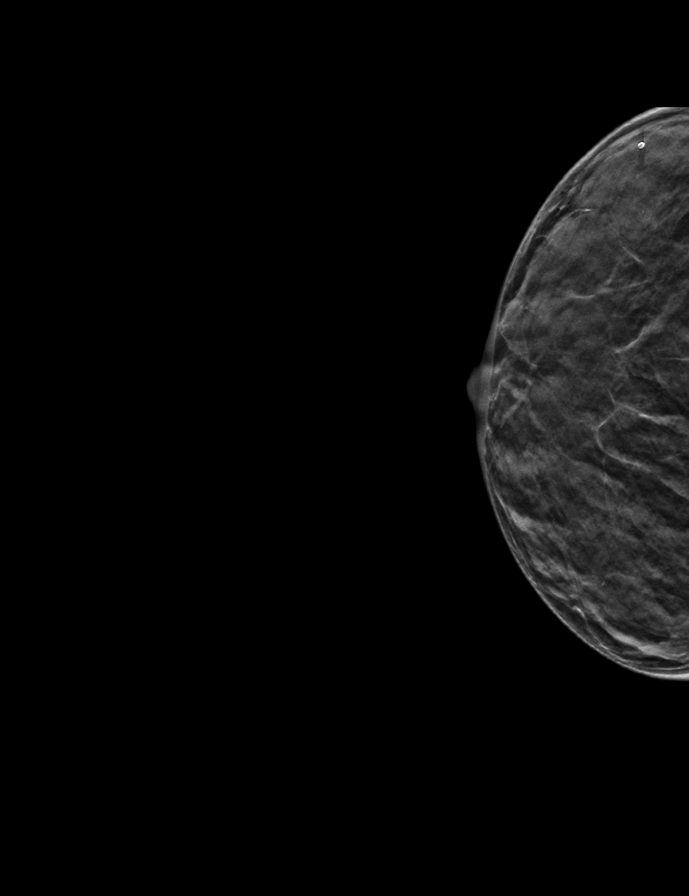

[R CC synth-2D (2 of 2)]
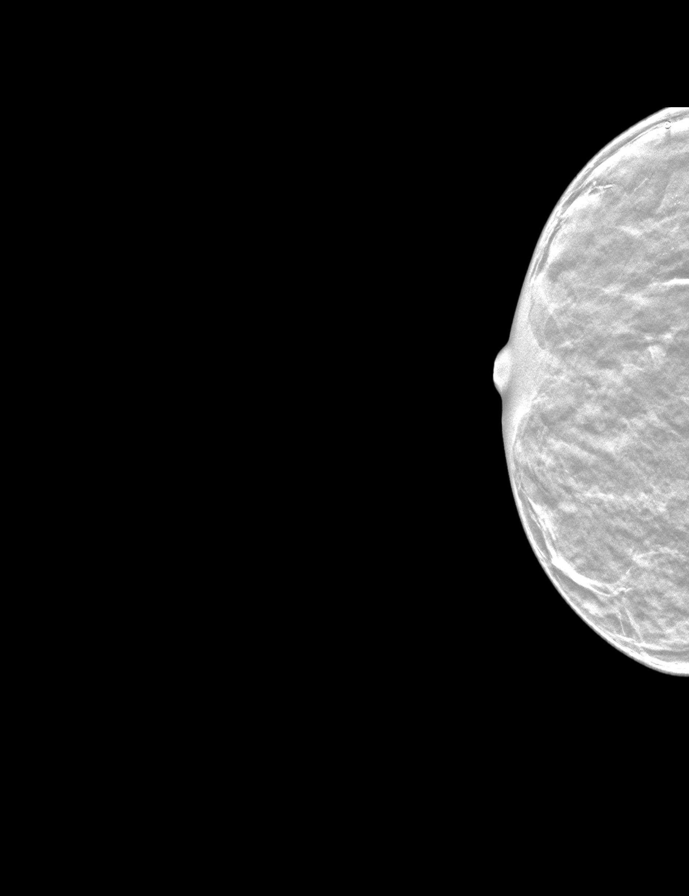

[L MLO synth-2D]
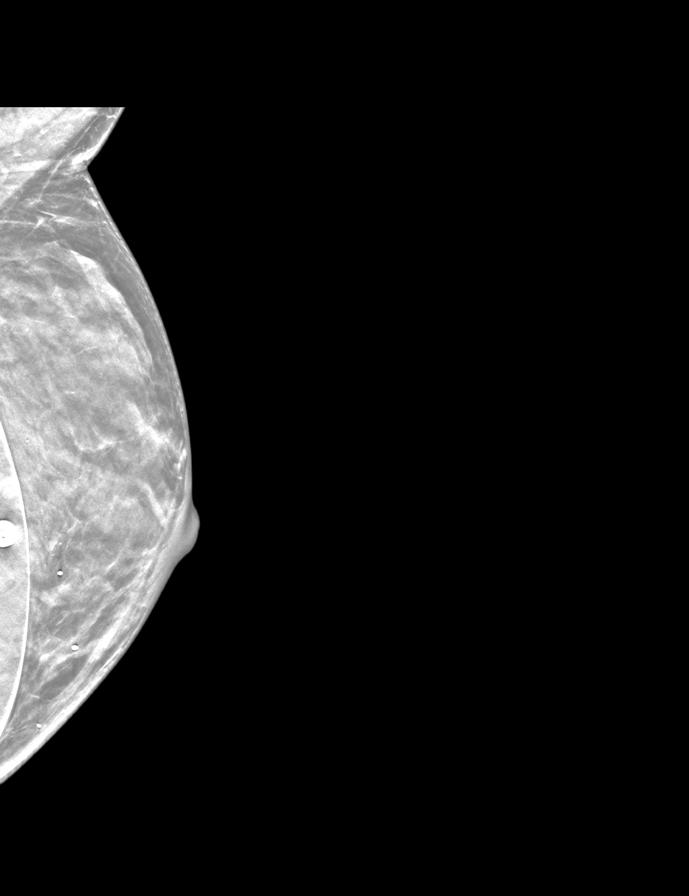

[L CC synth-2D]
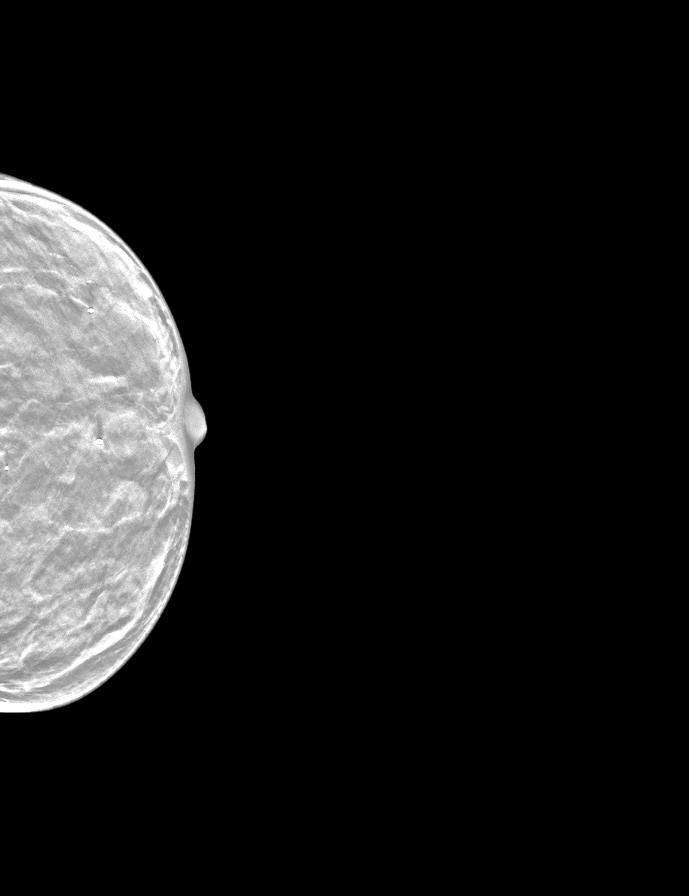

[R MLO synth-2D]
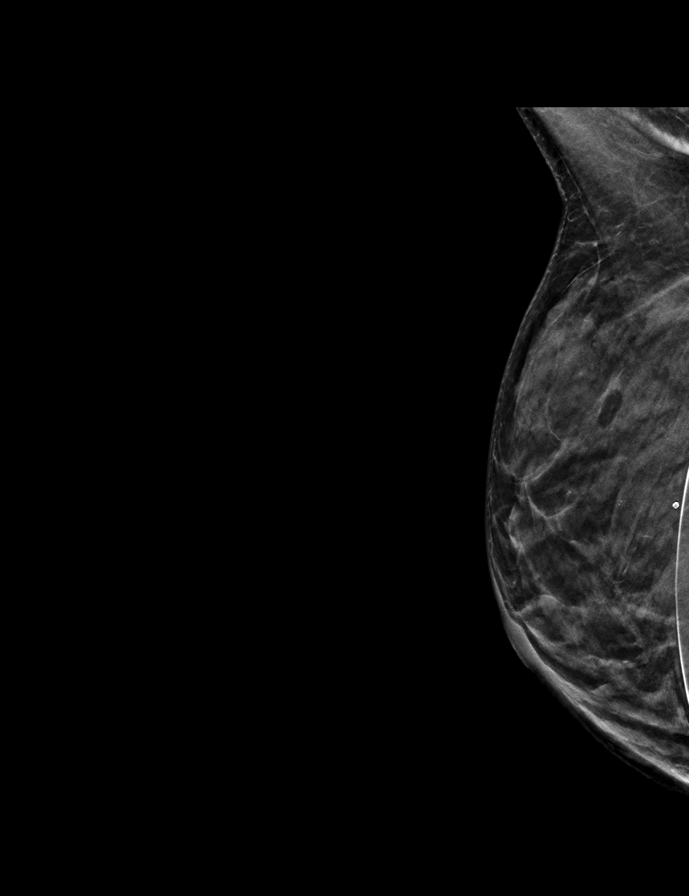

[9 of 34 positions shown; findings below may reference images not displayed]

ACR Breast Density Category d: The breast tissue is extremely dense,
which lowers the sensitivity of mammography.
FINDINGS: The patient has retropectoral implants. In the right breast,
calcifications warrant further evaluation with magnified views. In
the left breast, no findings suspicious for malignancy.
IMPRESSION: Further evaluation is suggested for calcifications in the right
breast.

RECOMMENDATION:
Diagnostic mammogram of the right breast. (Code:[id number]-[id number])

The patient will be contacted regarding the findings, and additional
imaging will be scheduled.

BI-RADS CATEGORY  0: Incomplete. Need additional imaging evaluation
and/or prior mammograms for comparison.

## 2022-09-23 IMAGING — MG MM DIGITAL DIAGNOSTIC UNILAT*R*
3 series · 3 of 3 positions shown · non-contrast
Comparison: Previous exam(s).

CLINICAL DATA: Calcifications in the posterior upper outer right
breast on a recent screening mammogram. The patient has
retropectoral right breast implants placed in 1844.

EXAM:
DIGITAL DIAGNOSTIC UNILATERAL RIGHT MAMMOGRAM
TECHNIQUE: Right digital diagnostic mammography was performed. Mammographic
images were processed with CAD.

[R ML (1 of 2)]
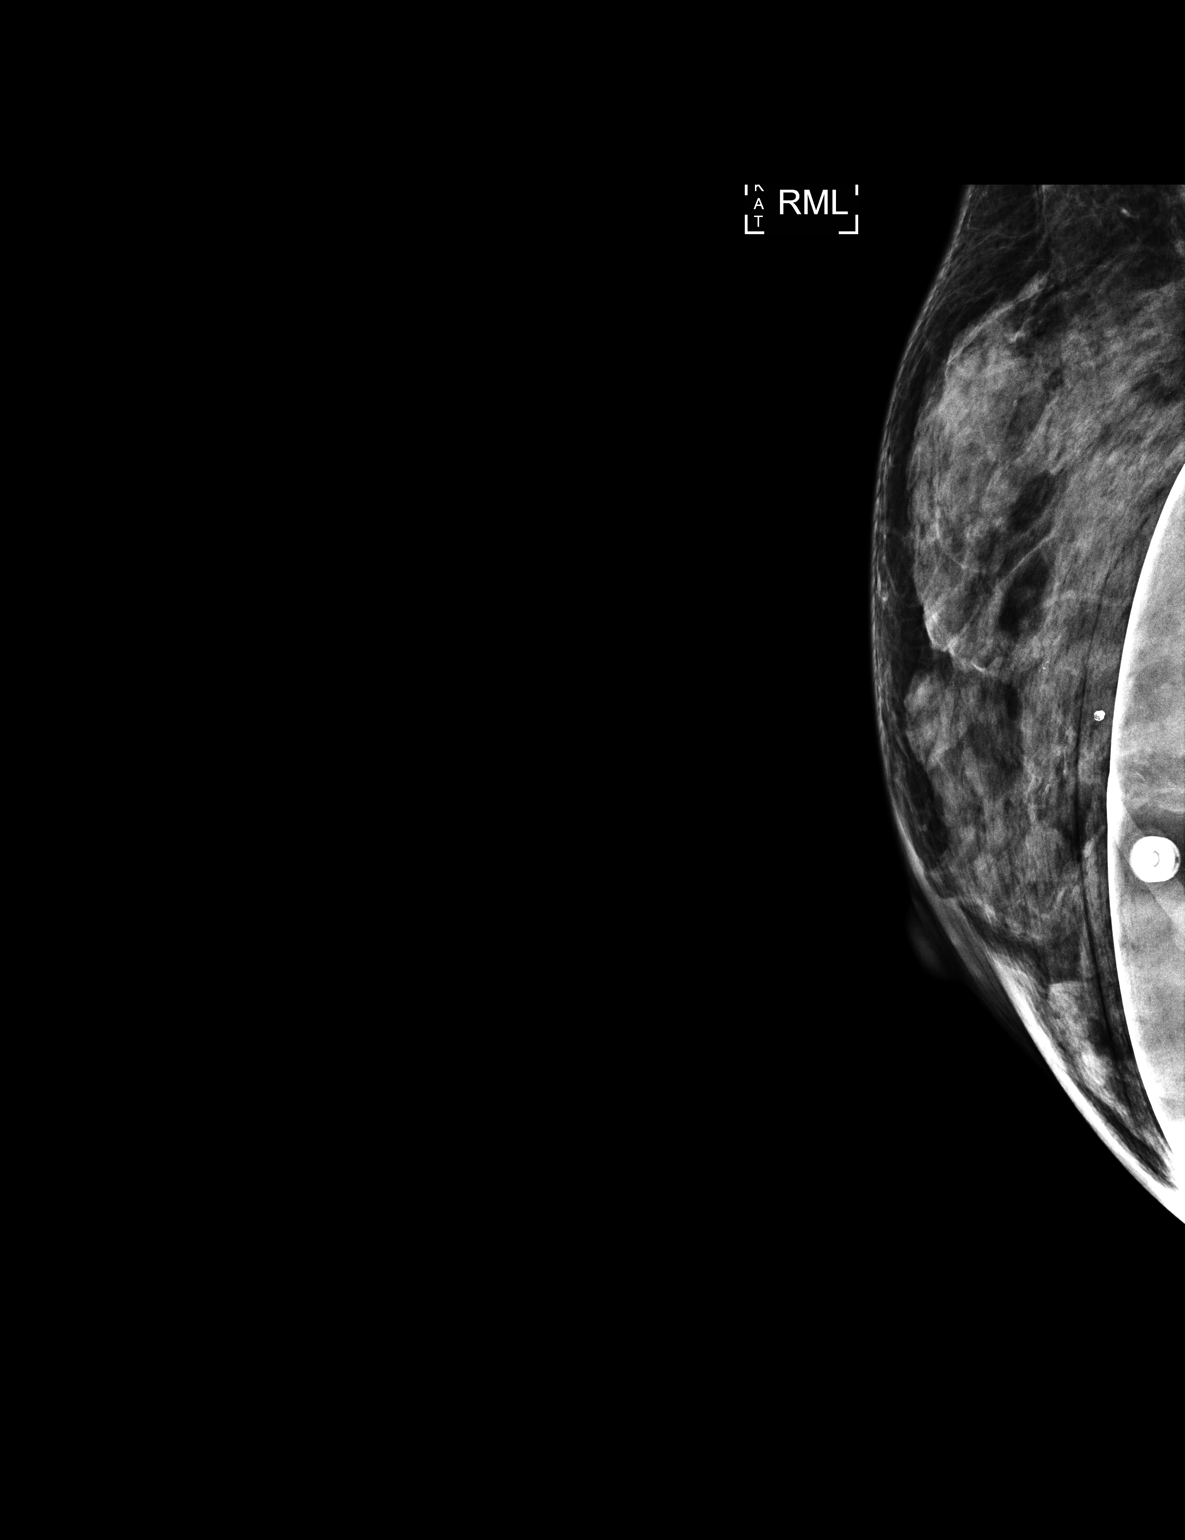

[R CC]
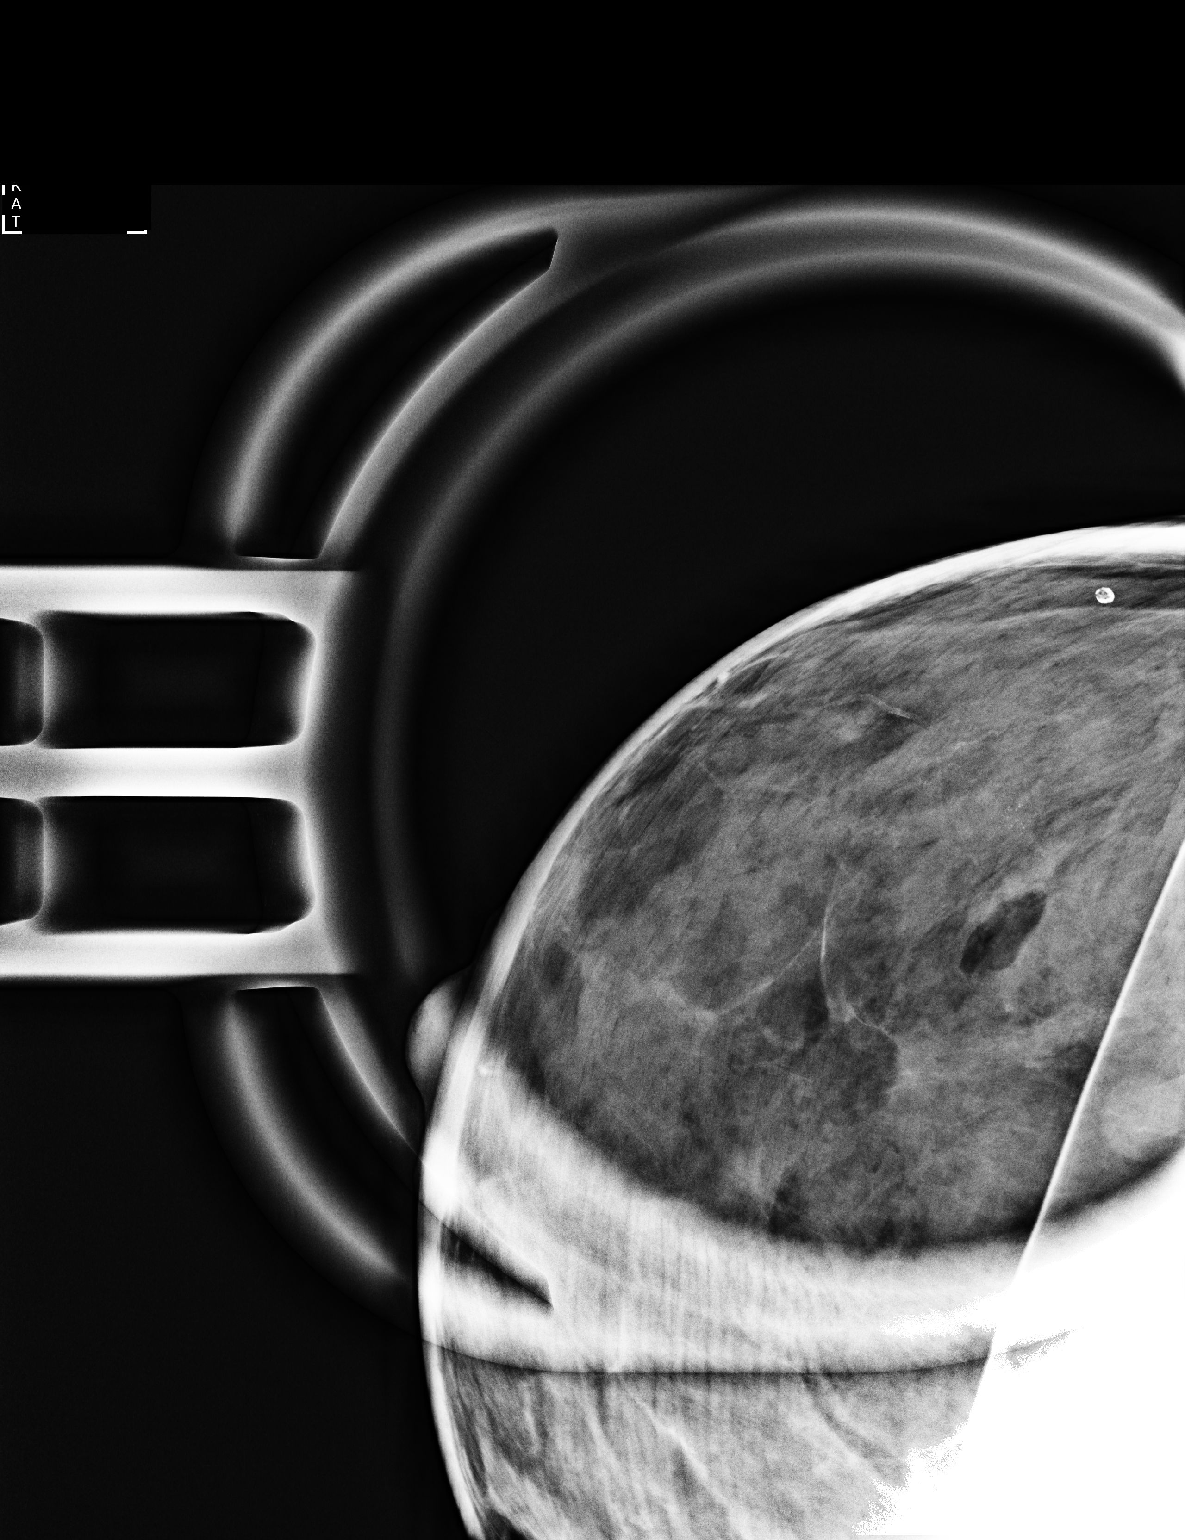

[R ML (2 of 2)]
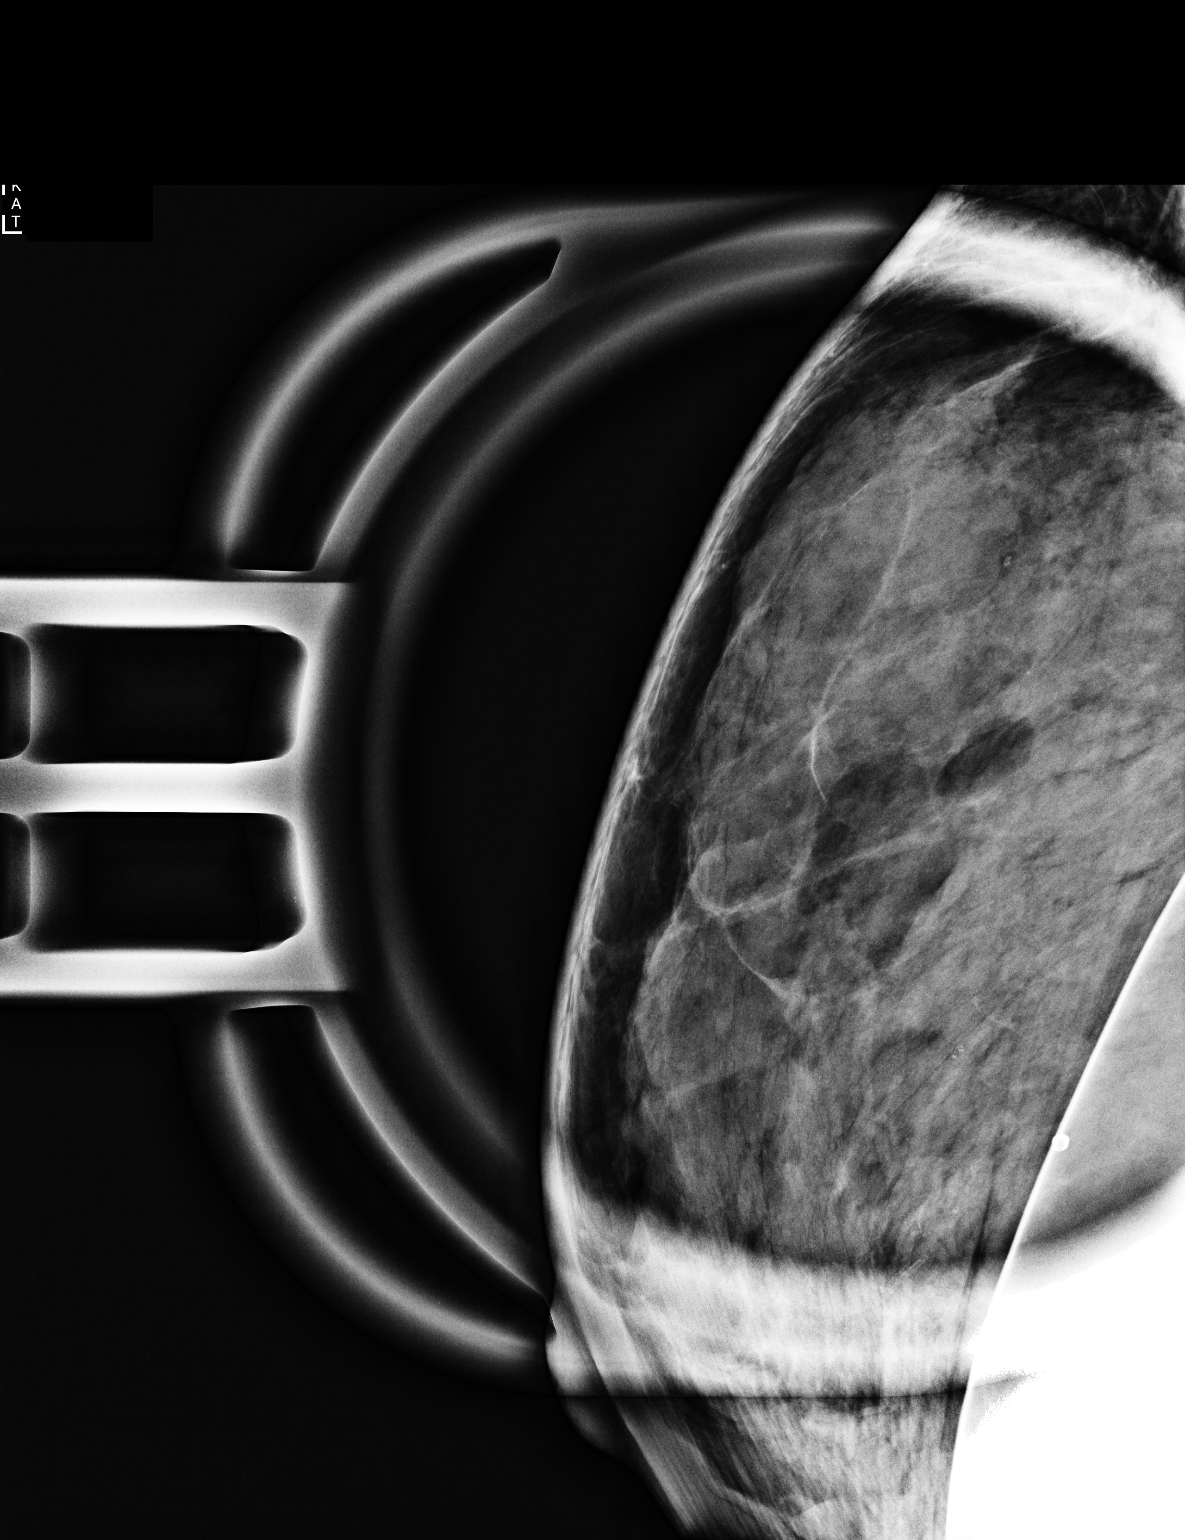

[3 of 3 positions shown; findings below may reference images not displayed]

ACR Breast Density Category d: The breast tissue is extremely dense,
which lowers the sensitivity of mammography.
FINDINGS: 2D true lateral and spot magnification implant displaced images of
the right breast were obtained. These demonstrate a 3 mm group of
punctate calcifications in the posterior upper outer quadrant of the
breast. In the craniocaudal projection, these are rounded with
somewhat indistinct margins. In the true lateral projection, these
demonstrate some dependent layering. There has been a similar group
of calcifications at this location compared to previous mammograms
dating back to 4008, without significant change. These were
evaluated in 3001 and shown to represent benign milk of calcium.
Those images are not available for direct comparison.
IMPRESSION: 3 mm group benign milk of calcium in the upper-outer quadrant of the
right breast.

RECOMMENDATION:
Bilateral screening mammogram in February 2022 when due.

I have discussed the findings and recommendations with the patient.
If applicable, a reminder letter will be sent to the patient
regarding the next appointment.

BI-RADS CATEGORY  2: Benign.

## 2022-12-16 ENCOUNTER — Ambulatory Visit: Payer: BC Managed Care – PPO | Admitting: Family Medicine

## 2022-12-16 ENCOUNTER — Encounter: Payer: Self-pay | Admitting: Family Medicine

## 2022-12-16 VITALS — BP 112/80 | Ht 63.0 in | Wt 108.0 lb

## 2022-12-16 DIAGNOSIS — M79671 Pain in right foot: Secondary | ICD-10-CM | POA: Diagnosis not present

## 2022-12-16 DIAGNOSIS — M79672 Pain in left foot: Secondary | ICD-10-CM

## 2022-12-16 NOTE — Progress Notes (Signed)
PCP: No primary care provider on file.  Subjective:   HPI: Patient is a 67 y.o. female here for custom orthotics.  Patient comes in today for custom orthotics. She's had issues with bilateral foot cramping for several years. Only occurs when walking - typically beyond 3 miles. Feels toes lock up on her and she cannot straighten them. No numbness or tingling. Is not progressive. Cramping is plantar in both feet.  Past Medical History:  Diagnosis Date   Anxiety    Complication of anesthesia    hard time waking up   Depression    Genital HSV    Insomnia    Migraines    PONV (postoperative nausea and vomiting)     Current Outpatient Medications on File Prior to Visit  Medication Sig Dispense Refill   acyclovir (ZOVIRAX) 400 MG tablet TAKE 1 TABLET BY MOUTH EVERY DAY 90 tablet 3   CALCIUM PO Take 1 tablet by mouth daily.     meloxicam (MOBIC) 7.5 MG tablet Take 7.5 mg by mouth daily.     Multiple Vitamin (MULTIVITAMIN WITH MINERALS) TABS tablet Take 1 tablet by mouth daily.     Omega-3 Fatty Acids (FISH OIL) 1000 MG CAPS Take by mouth.     sertraline (ZOLOFT) 100 MG tablet TAKE 1 AND 1/2 TABLETS DAILY 135 tablet 5   SUMAtriptan (IMITREX) 100 MG tablet Take 1 tablet (100 mg total) by mouth once. May repeat in 2 hours if headache persists or recurs. 10 tablet 12   zolpidem (AMBIEN) 10 MG tablet TAKE 1/2 TO 1 TABLET BY MOUTH AT BEDTIME 15 tablet 0   No current facility-administered medications on file prior to visit.    Past Surgical History:  Procedure Laterality Date   ABDOMINAL HYSTERECTOMY  1981   still has 1 ovary, other removed due to scar tissue from hysterectomy   ANTERIOR INTEROSSEOUS NERVE DECOMPRESSION Left 05/12/2022   Procedure: ulnar nerve decompression;  Surgeon: Gomez Cleverly, MD;  Location: [facility];  Service: Orthopedics;  Laterality: Left;  MAC and Local 60 min   AUGMENTATION MAMMAPLASTY     BREAST BIOPSY Left 1997   Benign Core   BREAST  CYST ASPIRATION  09/18/2004   Benign cyst asp   BREAST EXCISIONAL BIOPSY Left 1997   Benign exc bx   BREAST SURGERY  2011   bilateral implants   OLECRANON BURSECTOMY Left 05/12/2022   Procedure: OLECRANON (ELBOW) BURSA EXCISION;  Surgeon: Gomez Cleverly, MD;  Location: [facility];  Service: Orthopedics;  Laterality: Left;  MAC and local    Allergies  Allergen Reactions   Amoxicillin Hives    BP 112/80   Ht 5\' 3"  (1.6 m)   Wt 108 lb (49 kg)   BMI 19.13 kg/m       No data to display              No data to display              Objective:  Physical Exam:  Gen: NAD, comfortable in exam room  Bilateral feet/ankles: Pes cavus.  Small bunionette bilaterally.  Otherwise no gross deformity, swelling, ecchymoses Full range of motion ankles, digits. No tenderness to palpation  Negative ant drawer and negative talar tilt.   NV intact distally.   Assessment & Plan:  1. Bilateral foot cramping - custom orthotics made today and felt comfortable to patient.  She is doing flexion exercises of toes - reviewed these.  Also doing pilates.  Otc medication if needed.  F/u prn.  Patient was fitted for a : standard, cushioned, semi-rigid orthotic. The orthotic was heated and afterward the patient stood on the orthotic blank positioned on the orthotic stand. The patient was positioned in subtalar neutral position and 10 degrees of ankle dorsiflexion in a weight bearing stance. After completion of molding, a stable base was applied to the orthotic blank. The blank was ground to a stable position for weight bearing. Size: 7 fit and run Base: none Posting: none Additional orthotic padding: none

## 2023-02-16 ENCOUNTER — Other Ambulatory Visit: Payer: Self-pay | Admitting: Physician Assistant

## 2023-02-16 DIAGNOSIS — Z1231 Encounter for screening mammogram for malignant neoplasm of breast: Secondary | ICD-10-CM

## 2023-03-24 ENCOUNTER — Ambulatory Visit
Admission: RE | Admit: 2023-03-24 | Discharge: 2023-03-24 | Disposition: A | Discharge status: Home / Self Care | Payer: BC Managed Care – PPO | Source: Ambulatory Visit | Attending: Physician Assistant | Admitting: Physician Assistant

## 2023-03-24 DIAGNOSIS — Z1231 Encounter for screening mammogram for malignant neoplasm of breast: Secondary | ICD-10-CM
